# Patient Record
Sex: Female | Born: 1990 | Race: White | Hispanic: No | Marital: Single | Smoking: Former smoker
Health system: Southern US, Community
[De-identification: ages and names within clinical notes are randomized; demographics above are authoritative.]

## PROBLEM LIST (undated history)

## (undated) ENCOUNTER — Inpatient Hospital Stay: Payer: Self-pay

## (undated) DIAGNOSIS — M359 Systemic involvement of connective tissue, unspecified: Secondary | ICD-10-CM

## (undated) DIAGNOSIS — M419 Scoliosis, unspecified: Secondary | ICD-10-CM

## (undated) DIAGNOSIS — M329 Systemic lupus erythematosus, unspecified: Secondary | ICD-10-CM

## (undated) DIAGNOSIS — IMO0002 Reserved for concepts with insufficient information to code with codable children: Secondary | ICD-10-CM

## (undated) HISTORY — DX: Scoliosis, unspecified: M41.9

## (undated) HISTORY — PX: NO PAST SURGERIES: SHX2092

---
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
## 2008-04-14 ENCOUNTER — Ambulatory Visit: Payer: Self-pay | Admitting: Rheumatology

## 2008-12-16 ENCOUNTER — Emergency Department: Payer: Self-pay | Admitting: Unknown Physician Specialty

## 2011-02-27 ENCOUNTER — Emergency Department: Payer: Self-pay | Admitting: Emergency Medicine

## 2013-08-29 DIAGNOSIS — L930 Discoid lupus erythematosus: Secondary | ICD-10-CM | POA: Insufficient documentation

## 2013-11-18 ENCOUNTER — Emergency Department: Payer: Self-pay | Admitting: Emergency Medicine

## 2017-12-15 ENCOUNTER — Other Ambulatory Visit: Payer: Self-pay

## 2017-12-15 ENCOUNTER — Emergency Department
Admission: EM | Admit: 2017-12-15 | Discharge: 2017-12-15 | Disposition: A | Discharge status: Home / Self Care | Payer: Self-pay | Attending: Emergency Medicine | Admitting: Emergency Medicine

## 2017-12-15 ENCOUNTER — Encounter: Payer: Self-pay | Admitting: Emergency Medicine

## 2017-12-15 DIAGNOSIS — F17210 Nicotine dependence, cigarettes, uncomplicated: Secondary | ICD-10-CM | POA: Insufficient documentation

## 2017-12-15 DIAGNOSIS — R112 Nausea with vomiting, unspecified: Secondary | ICD-10-CM | POA: Insufficient documentation

## 2017-12-15 DIAGNOSIS — E8889 Other specified metabolic disorders: Secondary | ICD-10-CM | POA: Insufficient documentation

## 2017-12-15 DIAGNOSIS — E860 Dehydration: Secondary | ICD-10-CM | POA: Insufficient documentation

## 2017-12-15 HISTORY — DX: Reserved for concepts with insufficient information to code with codable children: IMO0002

## 2017-12-15 HISTORY — DX: Systemic lupus erythematosus, unspecified: M32.9

## 2017-12-15 LAB — COMPREHENSIVE METABOLIC PANEL
ALK PHOS: 61 U/L (ref 38–126)
ALT: 16 U/L (ref 0–44)
ANION GAP: 9 (ref 5–15)
AST: 22 U/L (ref 15–41)
Albumin: 4.7 g/dL (ref 3.5–5.0)
BUN: 9 mg/dL (ref 6–20)
CALCIUM: 9.7 mg/dL (ref 8.9–10.3)
CHLORIDE: 103 mmol/L (ref 98–111)
CO2: 26 mmol/L (ref 22–32)
CREATININE: 0.74 mg/dL (ref 0.44–1.00)
GFR calc non Af Amer: >60 mL/min (ref >60)
GLUCOSE: 131 mg/dL — AB (ref 70–99)
Potassium: 3.9 mmol/L (ref 3.5–5.1)
SODIUM: 138 mmol/L (ref 135–145)
Total Bilirubin: 0.8 mg/dL (ref 0.3–1.2)
Total Protein: 8 g/dL (ref 6.5–8.1)

## 2017-12-15 LAB — URINALYSIS, COMPLETE (UACMP) WITH MICROSCOPIC
Bacteria, UA: NONE SEEN
Bilirubin Urine: NEGATIVE
Glucose, UA: NEGATIVE mg/dL
Hgb urine dipstick: NEGATIVE
KETONES UR: 80 mg/dL — AB
Leukocytes, UA: NEGATIVE
Nitrite: NEGATIVE
PROTEIN: 100 mg/dL — AB
Specific Gravity, Urine: 1.026 (ref 1.005–1.030)
pH: 8 (ref 5.0–8.0)

## 2017-12-15 LAB — CBC
HCT: 42.8 % (ref 35.0–47.0)
HEMOGLOBIN: 14.6 g/dL (ref 12.0–16.0)
MCH: 31.2 pg (ref 26.0–34.0)
MCHC: 34.2 g/dL (ref 32.0–36.0)
MCV: 91.4 fL (ref 80.0–100.0)
Platelets: 327 10*3/uL (ref 150–440)
RBC: 4.69 MIL/uL (ref 3.80–5.20)
RDW: 13.7 % (ref 11.5–14.5)
WBC: 18.2 10*3/uL — ABNORMAL HIGH (ref 3.6–11.0)

## 2017-12-15 LAB — POCT PREGNANCY, URINE: Preg Test, Ur: NEGATIVE

## 2017-12-15 LAB — LIPASE, BLOOD: LIPASE: 19 U/L (ref 11–51)

## 2017-12-15 MED ORDER — ONDANSETRON HCL 4 MG PO TABS: 4.0000 mg | ORAL_TABLET | Freq: Every day | ORAL | 0 refills | Status: DC | PRN

## 2017-12-15 MED ORDER — SODIUM CHLORIDE 0.9 % IV BOLUS
1000.0000 mL | Freq: Once | INTRAVENOUS | Status: AC
  Administered 2017-12-15: 1000 mL via INTRAVENOUS

## 2017-12-15 MED ORDER — ONDANSETRON HCL 4 MG/2ML IJ SOLN
4.0000 mg | Freq: Once | INTRAMUSCULAR | Status: AC
  Administered 2017-12-15: 4 mg via INTRAVENOUS
  Filled 2017-12-15: qty 2

## 2017-12-15 NOTE — ED Triage Notes (Signed)
Vomiting x 2am today. Cant even keep water down.

## 2017-12-15 NOTE — ED Provider Notes (Signed)
[facility] Emergency Department Provider Note  ____________________________________________   First MD Initiated Contact with Patient 12/15/17 1751     (approximate)  I have reviewed the triage vital signs and the nursing notes.   HISTORY  Chief Complaint Emesis   HPI Michelle Massey is a 27 y.o. female who self presents to the emergency department with abdominal pain nausea and vomiting that began at 2 AM today roughly 15 hours prior to arrival.  Symptoms came on suddenly have been moderate to severe or worsened when trying to eat and improved when not.  The pain is nonradiating.    Past Medical History:  Diagnosis Date  . Lupus (HCC)     There are no active problems to display for this patient.   History reviewed. No pertinent surgical history.  Prior to Admission medications   Medication Sig Start Date End Date Taking? Authorizing Provider  ondansetron (ZOFRAN) 4 MG tablet Take 1 tablet (4 mg total) by mouth daily as needed. 12/15/17 12/15/18  Merrily Brittle, MD    Allergies Patient has no known allergies.  No family history on file.  Social History Social History   Tobacco Use  . Smoking status: Current Every Day Smoker    Packs/day: 0.50    Types: Cigarettes  Substance Use Topics  . Alcohol use: Not on file  . Drug use: Not on file    Review of Systems Constitutional: No fever/chills Eyes: No visual changes. ENT: No sore throat. Cardiovascular: Denies chest pain. Respiratory: Denies shortness of breath. Gastrointestinal: Positive for abdominal pain.  Positive for nausea, positive for vomiting.  No diarrhea.  No constipation. Genitourinary: Negative for dysuria. Musculoskeletal: Negative for back pain. Skin: Negative for rash. Neurological: Negative for headaches, focal weakness or numbness.   ____________________________________________   PHYSICAL EXAM:  VITAL SIGNS: ED Triage Vitals  Enc Vitals Group     BP 12/15/17  1542 106/79     Pulse Rate 12/15/17 1542 (!) 59     Resp 12/15/17 1542 20     Temp 12/15/17 1542 98.1 F (36.7 C)     Temp Source 12/15/17 1542 Oral     SpO2 12/15/17 1542 98 %     Weight 12/15/17 1544 140 lb (63.5 kg)     Height 12/15/17 1544 5\' 6"  (1.676 m)     Head Circumference --      Peak Flow --      Pain Score 12/15/17 1544 0     Pain Loc --      Pain Edu? --      Excl. in GC? --     Constitutional: Alert and oriented x4 pleasant cooperative speaks in full clear sentences no diaphoresis Eyes: PERRL EOMI. Head: Atraumatic. Nose: No congestion/rhinnorhea. Mouth/Throat: No trismus Neck: No stridor.   Cardiovascular: Normal rate, regular rhythm. Grossly normal heart sounds.  Good peripheral circulation. Respiratory: Normal respiratory effort.  No retractions. Lungs CTAB and moving good air Gastrointestinal: Soft nondistended nontender no rebound or guarding no peritonitis no McBurney's tenderness negative Rovsing's no costovertebral tenderness Musculoskeletal: No lower extremity edema   Neurologic:  Normal speech and language. No gross focal neurologic deficits are appreciated. Skin:  Skin is warm, dry and intact. No rash noted. Psychiatric: Mood and affect are normal. Speech and behavior are normal.    ____________________________________________   DIFFERENTIAL includes but not limited to  Morning sickness, ectopic pregnancy, cholecystitis, appendicitis, pyelonephritis ____________________________________________   LABS (all labs ordered are listed, but only  abnormal results are displayed)  Labs Reviewed  COMPREHENSIVE METABOLIC PANEL - Abnormal; Notable for the following components:      Result Value   Glucose, Bld 131 (*)    All other components within normal limits  CBC - Abnormal; Notable for the following components:   WBC 18.2 (*)    All other components within normal limits  URINALYSIS, COMPLETE (UACMP) WITH MICROSCOPIC - Abnormal; Notable for the  following components:   Color, Urine YELLOW (*)    APPearance HAZY (*)    Ketones, ur 80 (*)    Protein, ur 100 (*)    All other components within normal limits  LIPASE, BLOOD  POC URINE PREG, ED  POCT PREGNANCY, URINE    Lab work reviewed by me shows urinalysis with ketosis consistent with starvation.  Elevated white count is nonspecific and could be secondary to vomiting and pain __________________________________________  EKG   ____________________________________________  RADIOLOGY   ____________________________________________   PROCEDURES  Procedure(s) performed: no  Procedures  Critical Care performed: no  ____________________________________________   INITIAL IMPRESSION / ASSESSMENT AND PLAN / ED COURSE  Pertinent labs & imaging results that were available during my care of the patient were reviewed by me and considered in my medical decision making (see chart for details).   As part of my medical decision making, I reviewed the following data within the electronic MEDICAL RECORD NUMBER History obtained from family if available, nursing notes, old chart and ekg, as well as notes from prior ED visits.  By the time I saw the patient she had already received IV Zofran with significant improvement in her symptoms.  Her lab work shows an elevated white count but is otherwise unremarkable.  Her abdominal exam is completely benign.  I had a lengthy discussion with the patient and mother regarding the diagnostic uncertainty but that I did not believe she would benefit from ionizing radiation at this point.  Given a liter of IV fluids for her dehydration and another dose of Zofran and I will prescribe her Zofran for home.  Strict return precautions have been given.      ____________________________________________   FINAL CLINICAL IMPRESSION(S) / ED DIAGNOSES  Final diagnoses:  Intractable vomiting with nausea, unspecified vomiting type  Ketosis (HCC)  Dehydration       NEW MEDICATIONS STARTED DURING THIS VISIT:  Discharge Medication List as of 12/15/2017  6:13 PM    START taking these medications   Details  ondansetron (ZOFRAN) 4 MG tablet Take 1 tablet (4 mg total) by mouth daily as needed., Starting Sat 12/15/2017, Until Sun 12/15/2018, Print         Note:  This document was prepared using Dragon voice recognition software and may include unintentional dictation errors.     Merrily Brittleifenbark, Leanah Kolander, MD 12/16/17 [phone]

## 2017-12-15 NOTE — Discharge Instructions (Signed)
Please take your nausea medication as needed for severe symptoms and follow-up with primary care as needed.  Please make sure you return to the emergency department immediately for any new or worsening symptoms such as fevers, chills, worsening pain, if you cannot eat or drink, or for any other issues whatsoever.  It was a pleasure to take care of you today, and thank you for coming to our emergency department.  If you have any questions or concerns before leaving please ask the nurse to grab me and I'm more than happy to go through your aftercare instructions again.  If you were prescribed any opioid pain medication today such as Norco, Vicodin, Percocet, morphine, hydrocodone, or oxycodone please make sure you do not drive when you are taking this medication as it can alter your ability to drive safely.  If you have any concerns once you are home that you are not improving or are in fact getting worse before you can make it to your follow-up appointment, please do not hesitate to call 911 and come back for further evaluation.  Merrily BrittleNeil Morgana Rowley, MD  Results for orders placed or performed during the hospital encounter of 12/15/17  Lipase, blood  Result Value Ref Range   Lipase 19 11 - 51 U/L  Comprehensive metabolic panel  Result Value Ref Range   Sodium 138 135 - 145 mmol/L   Potassium 3.9 3.5 - 5.1 mmol/L   Chloride 103 98 - 111 mmol/L   CO2 26 22 - 32 mmol/L   Glucose, Bld 131 (H) 70 - 99 mg/dL   BUN 9 6 - 20 mg/dL   Creatinine, Ser 6.210.74 0.44 - 1.00 mg/dL   Calcium 9.7 8.9 - 30.810.3 mg/dL   Total Protein 8.0 6.5 - 8.1 g/dL   Albumin 4.7 3.5 - 5.0 g/dL   AST 22 15 - 41 U/L   ALT 16 0 - 44 U/L   Alkaline Phosphatase 61 38 - 126 U/L   Total Bilirubin 0.8 0.3 - 1.2 mg/dL   GFR calc non Af Amer >60 >60 mL/min   GFR calc Af Amer >60 >60 mL/min   Anion gap 9 5 - 15  CBC  Result Value Ref Range   WBC 18.2 (H) 3.6 - 11.0 K/uL   RBC 4.69 3.80 - 5.20 MIL/uL   Hemoglobin 14.6 12.0 - 16.0 g/dL     HCT 65.742.8 84.635.0 - 96.247.0 %   MCV 91.4 80.0 - 100.0 fL   MCH 31.2 26.0 - 34.0 pg   MCHC 34.2 32.0 - 36.0 g/dL   RDW 95.213.7 84.111.5 - 32.414.5 %   Platelets 327 150 - 440 K/uL  Urinalysis, Complete w Microscopic  Result Value Ref Range   Color, Urine YELLOW (A) YELLOW   APPearance HAZY (A) CLEAR   Specific Gravity, Urine 1.026 1.005 - 1.030   pH 8.0 5.0 - 8.0   Glucose, UA NEGATIVE NEGATIVE mg/dL   Hgb urine dipstick NEGATIVE NEGATIVE   Bilirubin Urine NEGATIVE NEGATIVE   Ketones, ur 80 (A) NEGATIVE mg/dL   Protein, ur 401100 (A) NEGATIVE mg/dL   Nitrite NEGATIVE NEGATIVE   Leukocytes, UA NEGATIVE NEGATIVE   RBC / HPF 6-10 0 - 5 RBC/hpf   WBC, UA 0-5 0 - 5 WBC/hpf   Bacteria, UA NONE SEEN NONE SEEN   Squamous Epithelial / LPF 6-10 0 - 5   Mucus PRESENT   Pregnancy, urine POC  Result Value Ref Range   Preg Test, Ur NEGATIVE NEGATIVE

## 2018-03-17 ENCOUNTER — Emergency Department
Admission: EM | Admit: 2018-03-17 | Discharge: 2018-03-17 | Disposition: A | Discharge status: Home / Self Care | Payer: Self-pay | Attending: Emergency Medicine | Admitting: Emergency Medicine

## 2018-03-17 ENCOUNTER — Emergency Department: Payer: Self-pay

## 2018-03-17 ENCOUNTER — Encounter: Payer: Self-pay | Admitting: Emergency Medicine

## 2018-03-17 DIAGNOSIS — R1084 Generalized abdominal pain: Secondary | ICD-10-CM

## 2018-03-17 DIAGNOSIS — F17210 Nicotine dependence, cigarettes, uncomplicated: Secondary | ICD-10-CM | POA: Insufficient documentation

## 2018-03-17 DIAGNOSIS — R112 Nausea with vomiting, unspecified: Secondary | ICD-10-CM

## 2018-03-17 DIAGNOSIS — Z79899 Other long term (current) drug therapy: Secondary | ICD-10-CM | POA: Insufficient documentation

## 2018-03-17 DIAGNOSIS — R1013 Epigastric pain: Secondary | ICD-10-CM | POA: Insufficient documentation

## 2018-03-17 HISTORY — DX: Systemic involvement of connective tissue, unspecified: M35.9

## 2018-03-17 LAB — CBC
HEMATOCRIT: 40.4 % (ref 36.0–46.0)
HEMOGLOBIN: 14 g/dL (ref 12.0–15.0)
MCH: 30.1 pg (ref 26.0–34.0)
MCHC: 34.7 g/dL (ref 30.0–36.0)
MCV: 86.9 fL (ref 80.0–100.0)
Platelets: 339 10*3/uL (ref 150–400)
RBC: 4.65 MIL/uL (ref 3.87–5.11)
RDW: 12.6 % (ref 11.5–15.5)
WBC: 9.2 10*3/uL (ref 4.0–10.5)
nRBC: 0 % (ref 0.0–0.2)

## 2018-03-17 LAB — I-STAT BETA HCG BLOOD, ED (NOT ORDERABLE): I-stat hCG, quantitative: <5 m[IU]/mL (ref <5)

## 2018-03-17 LAB — COMPREHENSIVE METABOLIC PANEL
ALBUMIN: 4.4 g/dL (ref 3.5–5.0)
ALT: 11 U/L (ref 0–44)
AST: 17 U/L (ref 15–41)
Alkaline Phosphatase: 46 U/L (ref 38–126)
Anion gap: 11 (ref 5–15)
BILIRUBIN TOTAL: 0.9 mg/dL (ref 0.3–1.2)
BUN: 13 mg/dL (ref 6–20)
CHLORIDE: 108 mmol/L (ref 98–111)
CO2: 17 mmol/L — ABNORMAL LOW (ref 22–32)
CREATININE: 0.67 mg/dL (ref 0.44–1.00)
Calcium: 9.5 mg/dL (ref 8.9–10.3)
GFR calc Af Amer: >60 mL/min (ref >60)
GLUCOSE: 92 mg/dL (ref 70–99)
POTASSIUM: 3.8 mmol/L (ref 3.5–5.1)
Sodium: 136 mmol/L (ref 135–145)
Total Protein: 7.2 g/dL (ref 6.5–8.1)

## 2018-03-17 LAB — LIPASE, BLOOD: Lipase: 24 U/L (ref 11–51)

## 2018-03-17 MED ORDER — METOCLOPRAMIDE HCL 5 MG/ML IJ SOLN
10.0000 mg | Freq: Once | INTRAMUSCULAR | Status: AC
  Administered 2018-03-17: 10 mg via INTRAVENOUS
  Filled 2018-03-17: qty 2

## 2018-03-17 MED ORDER — ONDANSETRON 4 MG PO TBDP: 4.0000 mg | ORAL_TABLET | Freq: Three times a day (TID) | ORAL | 0 refills | Status: DC | PRN

## 2018-03-17 MED ORDER — FAMOTIDINE IN NACL 20-0.9 MG/50ML-% IV SOLN
20.0000 mg | Freq: Once | INTRAVENOUS | Status: AC
  Administered 2018-03-17: 20 mg via INTRAVENOUS
  Filled 2018-03-17: qty 50

## 2018-03-17 MED ORDER — IOHEXOL 300 MG/ML  SOLN
75.0000 mL | Freq: Once | INTRAMUSCULAR | Status: AC | PRN
  Administered 2018-03-17: 75 mL via INTRAVENOUS

## 2018-03-17 MED ORDER — ONDANSETRON HCL 4 MG/2ML IJ SOLN
4.0000 mg | Freq: Once | INTRAMUSCULAR | Status: AC | PRN
  Administered 2018-03-17: 4 mg via INTRAVENOUS
  Filled 2018-03-17: qty 2

## 2018-03-17 MED ORDER — SODIUM CHLORIDE 0.9 % IV BOLUS
1000.0000 mL | Freq: Once | INTRAVENOUS | Status: AC
  Administered 2018-03-17: 1000 mL via INTRAVENOUS

## 2018-03-17 MED ORDER — FENTANYL CITRATE (PF) 100 MCG/2ML IJ SOLN
50.0000 ug | Freq: Once | INTRAMUSCULAR | Status: AC
  Administered 2018-03-17: 50 ug via INTRAVENOUS
  Filled 2018-03-17: qty 2

## 2018-03-17 NOTE — ED Notes (Signed)
Pt verbalized understanding of discharge instructions. NAD at this time. 

## 2018-03-17 NOTE — Discharge Instructions (Addendum)
As I explained to you, we are unable to rule out appendicitis at this time.  Therefore it is imperative that you return to the emergency room if your pain returns, is located on the right lower quadrant, if you have fever, if your nausea or vomiting recur.  Otherwise please [facility] with [facility] tomorrow for reevaluation.

## 2018-03-17 NOTE — Consult Note (Signed)
Surgical Consultation  03/17/2018  Michelle Massey is an 27 y.o. female.   Chief Complaint  Patient presents with  . Abdominal Pain  . Emesis     HPI: Dr. Alfred Levins requested surgical evaluation of this patient who presented to the ED with abdominal pain.  She woke up this AM with mild epigastric pain which became worse throughout the day.  Upon arrival to the ED, she began vomiting.  Her pain has migrated around her abdomen during her ED course.  At one point, it seemed to localize in the RLQ, so a CT scan was obtained to evaluate for appendicitis. No fevers or chills.  No diarrhea.  Emesis has been non-bloody and non-bilious.  She was in the hospital earlier this year for intractable nausea and vomiting, but apparently the abdominal pain that brought her to the ED is different than that. She denies pregnancy and states that her LMP was at the beginning of this month (roughly 20 days ago). Currently, she states that she is hungry and interested in eating dinner.  Past Medical History:  Diagnosis Date  . Collagen vascular disease ([place])   . Lupus ([place])     History reviewed. No pertinent surgical history.  No family history on file.  Social History:  reports that she has been smoking cigarettes. She has been smoking about 0.50 packs per day. She has never used smokeless tobacco. Her alcohol and drug histories are not on file.  Allergies: No Known Allergies  Medications reviewed.     ROS Full ROS performed and is otherwise negative other than what is stated in the HPI    BP (!) 142/98 (BP Location: Left Arm)   Pulse 70   Temp (!) 97.5 F (36.4 C) (Oral)   Resp 16   Ht '5\' 6"'  (1.676 m)   Wt 56.7 kg   SpO2 100%   BMI 20.18 kg/m   Physical Exam  Constitutional: She is oriented to person, place, and time. She appears well-developed.  Non-toxic appearance. She does not appear ill.  HENT:  Head: Normocephalic and atraumatic.  Poor dentition  Eyes: No scleral icterus.   Cardiovascular: Normal rate and regular rhythm.  Pulmonary/Chest: Effort normal.  Abdominal: Soft. Normal appearance and bowel sounds are normal.  Minimal tenderness on exam. Patient points to LUQ as site of most discomfort.  Able to tolerate deep palpation. No rebound, guarding, or other peritoneal signs. Specifically, there is minimal pain at McBurney's point.  Neurological: She is alert and oriented to person, place, and time.  Skin: Skin is warm and dry.  Psychiatric: She has a normal mood and affect.      Results for orders placed or performed during the hospital encounter of 03/17/18 (from the past 48 hour(s))  Lipase, blood     Status: None   Collection Time: 03/17/18  3:07 PM  Result Value Ref Range   Lipase 24 11 - 51 U/L    Comment: Performed at [facility], [address] [place], [place] [address]  Comprehensive metabolic panel     Status: Abnormal   Collection Time: 03/17/18  3:07 PM  Result Value Ref Range   Sodium 136 135 - 145 mmol/L   Potassium 3.8 3.5 - 5.1 mmol/L    Comment: HEMOLYSIS AT THIS LEVEL MAY AFFECT RESULT   Chloride 108 98 - 111 mmol/L   CO2 17 (L) 22 - 32 mmol/L   Glucose, Bld 92 70 - 99 mg/dL   BUN 13 6 -  20 mg/dL   Creatinine, Ser 0.67 0.44 - 1.00 mg/dL   Calcium 9.5 8.9 - 10.3 mg/dL   Total Protein 7.2 6.5 - 8.1 g/dL   Albumin 4.4 3.5 - 5.0 g/dL   AST 17 15 - 41 U/L   ALT 11 0 - 44 U/L   Alkaline Phosphatase 46 38 - 126 U/L   Total Bilirubin 0.9 0.3 - 1.2 mg/dL   GFR calc non Af Amer >60 >60 mL/min   GFR calc Af Amer >60 >60 mL/min    Comment: (NOTE) The eGFR has been calculated using the CKD EPI equation. This calculation has not been validated in all clinical situations. eGFR's persistently <60 mL/min signify possible Chronic Kidney Disease.    Anion gap 11 5 - 15    Comment: Performed at [facility], [address] [place], [place] [address]  CBC     Status: None   Collection Time: 03/17/18  3:07 PM  Result  Value Ref Range   WBC 9.2 4.0 - 10.5 K/uL   RBC 4.65 3.87 - 5.11 MIL/uL   Hemoglobin 14.0 12.0 - 15.0 g/dL   HCT 40.4 36.0 - 46.0 %   MCV 86.9 80.0 - 100.0 fL   MCH 30.1 26.0 - 34.0 pg   MCHC 34.7 30.0 - 36.0 g/dL   RDW 12.6 11.5 - 15.5 %   Platelets 339 150 - 400 K/uL   nRBC 0.0 0.0 - 0.2 %    Comment: Performed at [facility], [address] [place], [place] [address]  I-Stat beta hCG blood, ED     Status: None   Collection Time: 03/17/18  4:03 PM  Result Value Ref Range   I-stat hCG, quantitative <5.0 <5 mIU/mL   Comment 3            Comment:   GEST. AGE      CONC.  (mIU/mL)   <=1 WEEK        5 - 50     2 WEEKS       50 - 500     3 WEEKS       100 - 10,000     4 WEEKS     1,000 - 30,000        FEMALE AND NON-PREGNANT FEMALE:     LESS THAN 5 mIU/mL    Ct Abdomen Pelvis W Contrast  Result Date: 03/17/2018 CLINICAL DATA:  Mid to right upper abdominal pain since this morning with vomiting. No leukocytosis. History of lupus/collagen vascular disease. EXAM: CT ABDOMEN AND PELVIS WITH CONTRAST TECHNIQUE: Multidetector CT imaging of the abdomen and pelvis was performed using the standard protocol following bolus administration of intravenous contrast. CONTRAST:  63m OMNIPAQUE IOHEXOL 300 MG/ML  SOLN COMPARISON:  None. FINDINGS: Lower chest: Clear lung bases. No significant pleural or pericardial effusion. Hepatobiliary: The liver is normal in density without focal abnormality. No evidence of gallstones, gallbladder wall thickening or biliary dilatation. Pancreas: Unremarkable. No pancreatic ductal dilatation or surrounding inflammatory changes. Spleen: Normal in size without focal abnormality. Adrenals/Urinary Tract: Both adrenal glands appear normal. There are probable tiny left renal cysts. The kidneys, ureters and bladder otherwise appear normal. There is no evidence of urinary tract calculus or hydronephrosis. Stomach/Bowel: No enteric contrast was administered. The stomach  and small bowel appear normal. The colon is decompressed without wall thickening or surrounding inflammation. The cecum is located in the right pelvis. The appendix is fluid-filled and distended to 11 mm. Its wall  is mildly prominent, although there is no surrounding inflammation. Vascular/Lymphatic: There are no enlarged abdominal or pelvic lymph nodes. There are small ileocolonic mesenteric lymph nodes. No significant vascular findings. Reproductive: The uterus appears normal. Both ovaries are prominent. There is a small amount of free pelvic fluid. Other: No generalized ascites or focal extraluminal fluid collection. No free air. Musculoskeletal: No acute or significant osseous findings. IMPRESSION: 1. The appendix is fluid-filled and distended but without surrounding inflammation. This could reflect early acute appendicitis. No evidence of rupture, abscess or bowel obstruction. 2. No other significant findings. The gallbladder appears normal. Free pelvic fluid is within physiologic limits. 3. These results were called by telephone at the time of interpretation on 03/17/2018 at 5:51 pm to Dr. Rudene Re , who verbally acknowledged these results. Electronically Signed   By: Richardean Sale M.D.   On: 03/17/2018 17:52    Assessment/Plan: 27 yo F who came into the ED with abdominal pain. Her pain has not worsened and seems to shift location depending on the examiner and time of examination. Her abdominal exam is unconcerning, as are her labs.  While there are findings on CT that could suggest early appendicitis, her presentation is atypical and her examination does not correlate  At this time, I would not offer her appendectomy.  She can likely be d/c home with clear return precautions. No explicit General Surgery follow up at this time.  Fredirick Maudlin

## 2018-03-17 NOTE — ED Notes (Signed)
Hx of mother having gallbladder removed

## 2018-03-17 NOTE — ED Notes (Signed)
Actively vomiting in triage.  Green emesis

## 2018-03-17 NOTE — ED Triage Notes (Signed)
ARrives with C/o mid / right upper abdominal pain since this am.  Reports vomiting with symptoms.

## 2018-03-17 NOTE — ED Provider Notes (Signed)
[facility] Emergency Department Provider Note  ____________________________________________  Time seen: Approximately 3:32 PM  I have reviewed the triage vital signs and the nursing notes.   HISTORY  Chief Complaint Abdominal Pain and Emesis   HPI Michelle Massey is a 27 y.o. female presents for evaluation of abdominal pain nausea and vomiting.  Patient reports that she woke up this morning with mild epigastric abdominal pain.  Throughout the day the pain became progressively worse.  At this time her pain is 10 out of 10, stabbing, located in the epigastric region, constant and nonradiating.  She has had several episodes of nonbloody nonbilious emesis.  No fever or chills, no chest pain or shortness of breath, no dysuria or hematuria, no vaginal bleeding or vaginal discharge.  No prior abdominal surgeries.  Patient denies any prior similar episodes.  She has been seen in the past in the emergency room for intractable nausea and vomiting but reports never having pain like this before.  She denies drug use and alcohol.  She is a smoker.  Past Medical History:  Diagnosis Date  . Collagen vascular disease (HCC)   . Lupus (HCC)     Prior to Admission medications   Medication Sig Start Date End Date Taking? Authorizing Provider  ondansetron (ZOFRAN ODT) 4 MG disintegrating tablet Take 1 tablet (4 mg total) by mouth every 8 (eight) hours as needed. 03/17/18   Nita Sickle, MD  ondansetron (ZOFRAN) 4 MG tablet Take 1 tablet (4 mg total) by mouth daily as needed. 12/15/17 12/15/18  Merrily Brittle, MD    Allergies Patient has no known allergies.  No family history on file.  Social History Social History   Tobacco Use  . Smoking status: Current Every Day Smoker    Packs/day: 0.50    Types: Cigarettes  . Smokeless tobacco: Never Used  Substance Use Topics  . Alcohol use: Not on file  . Drug use: Not on file    Review of Systems  Constitutional:  Negative for fever. Eyes: Negative for visual changes. ENT: Negative for sore throat. Neck: No neck pain  Cardiovascular: Negative for chest pain. Respiratory: Negative for shortness of breath. Gastrointestinal: + abdominal pain, nausea, and vomiting. No diarrhea. Genitourinary: Negative for dysuria. Musculoskeletal: Negative for back pain. Skin: Negative for rash. Neurological: Negative for headaches, weakness or numbness. Psych: No SI or HI  ____________________________________________   PHYSICAL EXAM:  VITAL SIGNS: ED Triage Vitals  Enc Vitals Group     BP 03/17/18 1457 (!) 142/98     Pulse Rate 03/17/18 1457 70     Resp 03/17/18 1457 20     Temp --      Temp Source 03/17/18 1457 Oral     SpO2 03/17/18 1457 100 %     Weight 03/17/18 1458 125 lb (56.7 kg)     Height 03/17/18 1458 5\' 6"  (1.676 m)     Head Circumference --      Peak Flow --      Pain Score 03/17/18 1457 8     Pain Loc --      Pain Edu? --      Excl. in GC? --     Constitutional: Alert and oriented, in significant distress due to pain, laying prone, actively vomiting.  HEENT:      Head: Normocephalic and atraumatic.         Eyes: Conjunctivae are normal. Sclera is non-icteric.       Mouth/Throat: Mucous membranes  are moist.       Neck: Supple with no signs of meningismus. Cardiovascular: Regular rate and rhythm. No murmurs, gallops, or rubs. 2+ symmetrical distal pulses are present in all extremities. No JVD. Respiratory: Normal respiratory effort. Lungs are clear to auscultation bilaterally. No wheezes, crackles, or rhonchi.  Gastrointestinal: Soft, diffuse tenderness to palpation worse on the upper quadrants, and non distended with positive bowel sounds. No rebound or guarding. Musculoskeletal: Nontender with normal range of motion in all extremities. No edema, cyanosis, or erythema of extremities. Neurologic: Normal speech and language. Face is symmetric. Moving all extremities. No gross focal  neurologic deficits are appreciated. Skin: Skin is warm, dry and intact. No rash noted. Psychiatric: Mood and affect are normal. Speech and behavior are normal.  ____________________________________________   LABS (all labs ordered are listed, but only abnormal results are displayed)  Labs Reviewed  COMPREHENSIVE METABOLIC PANEL - Abnormal; Notable for the following components:      Result Value   CO2 17 (*)    All other components within normal limits  LIPASE, BLOOD  CBC  URINALYSIS, COMPLETE (UACMP) WITH MICROSCOPIC  URINE DRUG SCREEN, QUALITATIVE ([facility] ONLY)  I-STAT BETA HCG BLOOD, ED ([facility], [facility], [facility] ONLY)  I-STAT BETA HCG BLOOD, ED (NOT ORDERABLE)   ____________________________________________  EKG  ED ECG REPORT I, Nita Sickle, the attending physician, personally viewed and interpreted this ECG.  Sinus bradycardia, rate of 46, normal intervals, right axis deviation, no ST elevations or depressions.  No prior for comparison. ____________________________________________  RADIOLOGY  I have personally reviewed the images performed during this visit and I agree with the Radiologist's read.   Interpretation by Radiologist:  Ct Abdomen Pelvis W Contrast  Result Date: 03/17/2018 CLINICAL DATA:  Mid to right upper abdominal pain since this morning with vomiting. No leukocytosis. History of lupus/collagen vascular disease. EXAM: CT ABDOMEN AND PELVIS WITH CONTRAST TECHNIQUE: Multidetector CT imaging of the abdomen and pelvis was performed using the standard protocol following bolus administration of intravenous contrast. CONTRAST:  75mL OMNIPAQUE IOHEXOL 300 MG/ML  SOLN COMPARISON:  None. FINDINGS: Lower chest: Clear lung bases. No significant pleural or pericardial effusion. Hepatobiliary: The liver is normal in density without focal abnormality. No evidence of gallstones, gallbladder wall thickening or biliary dilatation. Pancreas: Unremarkable. No pancreatic ductal dilatation  or surrounding inflammatory changes. Spleen: Normal in size without focal abnormality. Adrenals/Urinary Tract: Both adrenal glands appear normal. There are probable tiny left renal cysts. The kidneys, ureters and bladder otherwise appear normal. There is no evidence of urinary tract calculus or hydronephrosis. Stomach/Bowel: No enteric contrast was administered. The stomach and small bowel appear normal. The colon is decompressed without wall thickening or surrounding inflammation. The cecum is located in the right pelvis. The appendix is fluid-filled and distended to 11 mm. Its wall is mildly prominent, although there is no surrounding inflammation. Vascular/Lymphatic: There are no enlarged abdominal or pelvic lymph nodes. There are small ileocolonic mesenteric lymph nodes. No significant vascular findings. Reproductive: The uterus appears normal. Both ovaries are prominent. There is a small amount of free pelvic fluid. Other: No generalized ascites or focal extraluminal fluid collection. No free air. Musculoskeletal: No acute or significant osseous findings. IMPRESSION: 1. The appendix is fluid-filled and distended but without surrounding inflammation. This could reflect early acute appendicitis. No evidence of rupture, abscess or bowel obstruction. 2. No other significant findings. The gallbladder appears normal. Free pelvic fluid is within physiologic limits. 3. These results were called by telephone at the time  of interpretation on 03/17/2018 at 5:51 pm to Dr. Nita Sickle , who verbally acknowledged these results. Electronically Signed   By: Carey Bullocks M.D.   On: 03/17/2018 17:52      ____________________________________________   PROCEDURES  Procedure(s) performed: None Procedures Critical Care performed:  None ____________________________________________   INITIAL IMPRESSION / ASSESSMENT AND PLAN / ED COURSE  27 y.o. female presents for evaluation of abdominal pain nausea and  vomiting.  Patient is in significant distress, laying prone in bed and actively vomiting, she looks very uncomfortable, her abdomen is soft with diffuse tenderness worse in the upper quadrants, she is afebrile with normal vital signs otherwise.  Her abdomen is soft with no distention but diffuse tenderness throughout, there is no localized tenderness, rebound or guarding.  Differential diagnosis broad and includes but not limited to peptic ulcer disease versus gastritis versus pancreatitis versus gallbladder disease versus appendicitis versus ovarian pathology versus ectopic pregnancy.  Will give IV fluids, fentanyl, Reglan since patient has received Zofran in the waiting room with no significant improvement.  Will get CBC, CMP, lipase, hCG, and urine studies to help guide which kind of imaging patient needs.  Clinical Course as of Mar 17 1914  Wynelle Link Mar 17, 2018  1643 Patient is slightly more comfortable at this time.  She is tender to palpation on the right lower quadrant therefore a CT abdomen pelvis has been ordered to rule out appendicitis.   [CV]  1910 CT was inconclusive for appendicitis.  Discussed with Dr. Lady Gary, surgery on-call who evaluated patient in the emergency room.  She examined the patient and looked at the CT and labs.  Patient at this time has no right lower quadrant tenderness, her abdomen is very soft with no tenderness throughout.  That with a normal white count and normal vitals Dr. Lady Gary believes that patient does not have appendicitis.  I also agree that the clinical picture at this time is not consistent with appendicitis.  I have given very strict return precautions to the patient.  I will provide her with a prescription for Zofran but told her I would not give her any pain medication because if her pain gets worse I would like for her to come back to the emergency room.  Recommended 24-hour reevaluation by her primary care doctor.   [CV]    Clinical Course User Index [CV]  Don Perking [place], MD     As part of my medical decision making, I reviewed the following data within the electronic MEDICAL RECORD NUMBER Nursing notes reviewed and incorporated, Labs reviewed , EKG interpreted , Old chart reviewed, Radiograph reviewed , A consult was requested and obtained from this/these consultant(s) Surgery, Notes from prior ED visits and [place] Controlled Substance Database    Pertinent labs & imaging results that were available during my care of the patient were reviewed by me and considered in my medical decision making (see chart for details).    ____________________________________________   FINAL CLINICAL IMPRESSION(S) / ED DIAGNOSES  Final diagnoses:  Generalized abdominal pain  Non-intractable vomiting with nausea, unspecified vomiting type      NEW MEDICATIONS STARTED DURING THIS VISIT:  ED Discharge Orders         Ordered    ondansetron (ZOFRAN ODT) 4 MG disintegrating tablet  Every 8 hours PRN     03/17/18 1913           Note:  This document was prepared using Dragon voice recognition software and may include unintentional dictation  errors.    Nita SickleVeronese, [place], MD 03/17/18 [phone]

## 2018-07-11 ENCOUNTER — Other Ambulatory Visit: Payer: Self-pay

## 2018-07-11 ENCOUNTER — Emergency Department
Admission: EM | Admit: 2018-07-11 | Discharge: 2018-07-11 | Disposition: A | Discharge status: Home / Self Care | Payer: Self-pay | Attending: Emergency Medicine | Admitting: Emergency Medicine

## 2018-07-11 ENCOUNTER — Encounter: Payer: Self-pay | Admitting: Emergency Medicine

## 2018-07-11 DIAGNOSIS — Z5321 Procedure and treatment not carried out due to patient leaving prior to being seen by health care provider: Secondary | ICD-10-CM | POA: Insufficient documentation

## 2018-07-11 DIAGNOSIS — R55 Syncope and collapse: Secondary | ICD-10-CM | POA: Insufficient documentation

## 2018-07-11 LAB — URINALYSIS, COMPLETE (UACMP) WITH MICROSCOPIC
Bacteria, UA: NONE SEEN
Bilirubin Urine: NEGATIVE
Glucose, UA: NEGATIVE mg/dL
Hgb urine dipstick: NEGATIVE
Ketones, ur: NEGATIVE mg/dL
Leukocytes,Ua: NEGATIVE
Nitrite: NEGATIVE
PROTEIN: NEGATIVE mg/dL
Specific Gravity, Urine: 1.009 (ref 1.005–1.030)
pH: 6 (ref 5.0–8.0)

## 2018-07-11 LAB — CBC
HCT: 42.4 % (ref 36.0–46.0)
HEMOGLOBIN: 14 g/dL (ref 12.0–15.0)
MCH: 30.2 pg (ref 26.0–34.0)
MCHC: 33 g/dL (ref 30.0–36.0)
MCV: 91.4 fL (ref 80.0–100.0)
Platelets: 310 10*3/uL (ref 150–400)
RBC: 4.64 MIL/uL (ref 3.87–5.11)
RDW: 13.2 % (ref 11.5–15.5)
WBC: 8.2 10*3/uL (ref 4.0–10.5)
nRBC: 0 % (ref 0.0–0.2)

## 2018-07-11 LAB — BASIC METABOLIC PANEL
Anion gap: 7 (ref 5–15)
BUN: 10 mg/dL (ref 6–20)
CHLORIDE: 104 mmol/L (ref 98–111)
CO2: 26 mmol/L (ref 22–32)
Calcium: 8.8 mg/dL — ABNORMAL LOW (ref 8.9–10.3)
Creatinine, Ser: 0.72 mg/dL (ref 0.44–1.00)
GFR calc Af Amer: >60 mL/min (ref >60)
GFR calc non Af Amer: >60 mL/min (ref >60)
Glucose, Bld: 98 mg/dL (ref 70–99)
Potassium: 3.5 mmol/L (ref 3.5–5.1)
Sodium: 137 mmol/L (ref 135–145)

## 2018-07-11 LAB — POCT PREGNANCY, URINE: Preg Test, Ur: NEGATIVE

## 2018-07-11 MED ORDER — SODIUM CHLORIDE 0.9% FLUSH: 3.0000 mL | Freq: Once | INTRAVENOUS | Status: DC

## 2018-07-11 NOTE — ED Triage Notes (Addendum)
PT states she has a syncopal episode today. Pt states she has had lightheadness in the past. PT denies any CP or SOB. NAD noted . Denies head injury

## 2018-10-15 ENCOUNTER — Emergency Department
Admission: EM | Admit: 2018-10-15 | Discharge: 2018-10-16 | Disposition: A | Discharge status: Home / Self Care | Payer: Self-pay | Attending: Student in an Organized Health Care Education/Training Program | Admitting: Student in an Organized Health Care Education/Training Program

## 2018-10-15 ENCOUNTER — Other Ambulatory Visit: Payer: Self-pay

## 2018-10-15 DIAGNOSIS — R519 Headache, unspecified: Secondary | ICD-10-CM

## 2018-10-15 DIAGNOSIS — Z79899 Other long term (current) drug therapy: Secondary | ICD-10-CM | POA: Insufficient documentation

## 2018-10-15 DIAGNOSIS — F17210 Nicotine dependence, cigarettes, uncomplicated: Secondary | ICD-10-CM | POA: Insufficient documentation

## 2018-10-15 DIAGNOSIS — R51 Headache: Secondary | ICD-10-CM | POA: Insufficient documentation

## 2018-10-15 MED ORDER — DEXAMETHASONE SODIUM PHOSPHATE 10 MG/ML IJ SOLN
10.0000 mg | Freq: Once | INTRAMUSCULAR | Status: AC
  Administered 2018-10-16: 10 mg via INTRAVENOUS
  Filled 2018-10-15: qty 1

## 2018-10-15 MED ORDER — DIPHENHYDRAMINE HCL 50 MG/ML IJ SOLN
12.5000 mg | Freq: Once | INTRAMUSCULAR | Status: AC
  Administered 2018-10-16: 12.5 mg via INTRAVENOUS
  Filled 2018-10-15: qty 1

## 2018-10-15 MED ORDER — PROCHLORPERAZINE EDISYLATE 10 MG/2ML IJ SOLN
10.0000 mg | Freq: Once | INTRAMUSCULAR | Status: AC
  Administered 2018-10-16: 10 mg via INTRAVENOUS
  Filled 2018-10-15: qty 2

## 2018-10-15 MED ORDER — SODIUM CHLORIDE 0.9 % IV BOLUS
1000.0000 mL | Freq: Once | INTRAVENOUS | Status: AC
  Administered 2018-10-16: 1000 mL via INTRAVENOUS

## 2018-10-15 NOTE — ED Triage Notes (Signed)
Pt in with co migraine since this am and nausea, hx of the same. Took tylenol and ibuprofen without relief.

## 2018-10-15 NOTE — ED Provider Notes (Signed)
[facility] Emergency Department Provider Note    First MD Initiated Contact with Patient 10/15/18 2345     (approximate)  I have reviewed the triage vital signs and the nursing notes.   HISTORY  Chief Complaint Migraine    HPI Michelle Massey is a 28 y.o. female listed past medical history presents the ER for headache.  States he has a history of migraine headaches.  This 1 is been ongoing since yesterday.  Was not sudden onset.  Denies any neck stiffness.  Denies any fevers.  No congestion cough or shortness of breath.  Has felt nauseated.  Does endorse photophobia.  Tried some Tylenol Motrin without any improvement.  Reports headache is moderate in nature.    Past Medical History:  Diagnosis Date  . Collagen vascular disease ([place])   . Lupus ([place])    No family history on file. No past surgical history on file. There are no active problems to display for this patient.     Prior to Admission medications   Medication Sig Start Date End Date Taking? Authorizing Provider  ondansetron (ZOFRAN ODT) 4 MG disintegrating tablet Take 1 tablet (4 mg total) by mouth every 8 (eight) hours as needed. 03/17/18   Rudene Re, MD  ondansetron (ZOFRAN) 4 MG tablet Take 1 tablet (4 mg total) by mouth daily as needed. 12/15/17 12/15/18  Darel Hong, MD    Allergies Patient has no known allergies.    Social History Social History   Tobacco Use  . Smoking status: Current Every Day Smoker    Packs/day: 0.50    Types: Cigarettes  . Smokeless tobacco: Never Used  Substance Use Topics  . Alcohol use: Not Currently  . Drug use: Never    Review of Systems Patient denies headaches, rhinorrhea, blurry vision, numbness, shortness of breath, chest pain, edema, cough, abdominal pain, nausea, vomiting, diarrhea, dysuria, fevers, rashes or hallucinations unless otherwise stated above in HPI. ____________________________________________   PHYSICAL EXAM:   VITAL SIGNS: Vitals:   10/15/18 2018  BP: (!) 145/94  Pulse: (!) 112  Resp: 20  Temp: 99.6 F (37.6 C)  SpO2: 99%    Constitutional: Alert and oriented. Nontoxic appearing.  Ambulating into room without difficulty Eyes: Conjunctivae are normal.  Head: Atraumatic. Nose: No congestion/rhinnorhea. Mouth/Throat: Mucous membranes are moist.   Neck: No stridor. Painless ROM.  Cardiovascular: Normal rate, regular rhythm. Grossly normal heart sounds.  Good peripheral circulation. Respiratory: Normal respiratory effort.  No retractions. Lungs CTAB. Gastrointestinal: Soft and nontender. No distention. No abdominal bruits. No CVA tenderness. Genitourinary: deferred Musculoskeletal: No lower extremity tenderness nor edema.  No joint effusions. Neurologic CN- intact.  No facial droop, Normal FNF.  Normal heel to shin.  Sensation intact bilaterally. Normal speech and language. No gross focal neurologic deficits are appreciated. No gait instability. Skin:  Skin is warm, dry and intact. No rash noted. Psychiatric: Mood and affect are normal. Speech and behavior are normal.  ____________________________________________   LABS (all labs ordered are listed, but only abnormal results are displayed)  No results found for this or any previous visit (from the past 24 hour(s)). ____________________________________________ ____________________________________________  [id number]   ____________________________________________   PROCEDURES  Procedure(s) performed:  Procedures    Critical Care performed: no ____________________________________________   INITIAL IMPRESSION / ASSESSMENT AND PLAN / ED COURSE  Pertinent labs & imaging results that were available during my care of the patient were reviewed by me and considered in my medical decision making (  see chart for details).   DDX: tension, cluster, migraine, uri, covid19, doubt mening or encephalitis  [name] is a 28 y.o. who  presents to the ED with with Hx of migraines p/w HA for last 2 days. Not worst HA ever. Gradual onset. HA similar to previous episodes. Denies focal neurologic symptoms. Denies trauma. No fevers or neck pain. No vision loss. Afebrile in ED. VSS. Exam as above. No meningeal signs. No CN, motor, sensory or cerebellar deficits. Temporal arteries palpable and non-tender. Appears well and non-toxic.  Will provide IV fluids for hydration and IV medications for symptom control. Likely tension, non-specific or possible migraine HA. Clinical picture is not consistent with ICH, SAH, SDH, EDH, TIA, or CVA. No concern for meningitis or encephalitis. No concern for GCA/Temporal arteritis. .    Clinical Course as of Oct 16 119  Wed Oct 16, 2018  0120 Patient reassessed.  Headache resolved.  Repeat neuro exam reassuring and unchanged.  Stable appropriate for outpatient follow-up.   [PR]    Clinical Course User Index [PR] Willy Eddyobinson, Madelaine Whipple, MD    The patient was evaluated in Emergency Department today for the symptoms described in the history of present illness. He/she was evaluated in the context of the global COVID-19 pandemic, which necessitated consideration that the patient might be at risk for infection with the SARS-CoV-2 virus that causes COVID-19. Institutional protocols and algorithms that pertain to the evaluation of patients at risk for COVID-19 are in a state of rapid change based on information released by regulatory bodies including the CDC and federal and state organizations. These policies and algorithms were followed during the patient's care in the ED.  As part of my medical decision making, I reviewed the following data within the electronic MEDICAL RECORD NUMBER Nursing notes reviewed and incorporated, Labs reviewed, notes from prior ED visits and [place] Controlled Substance Database   ____________________________________________   FINAL CLINICAL IMPRESSION(S) / ED DIAGNOSES  Final diagnoses:   Bad headache      NEW MEDICATIONS STARTED DURING THIS VISIT:  New Prescriptions   No medications on file     Note:  This document was prepared using Dragon voice recognition software and may include unintentional dictation errors.    Willy Eddyobinson, Mical Brun, MD 10/16/18 [phone]

## 2018-10-16 NOTE — Discharge Instructions (Addendum)

## 2018-10-18 ENCOUNTER — Telehealth: Payer: Self-pay | Admitting: *Deleted

## 2018-10-18 NOTE — Telephone Encounter (Signed)
Coronavirus (COVID-19) Are you at risk?  Are you at risk for the Coronavirus (COVID-19)?  To be considered HIGH RISK for Coronavirus (COVID-19), you have to meet the following criteria:  . Traveled to [place], [place], [place], [place] or [place]; or in the [place] to [place], [place], [place], or [place]; and have fever, cough, and shortness of breath within the last 2 weeks of travel OR . Been in close contact with a person diagnosed with COVID-19 within the last 2 weeks and have fever, cough, and shortness of breath . IF YOU DO NOT MEET THESE CRITERIA, YOU ARE CONSIDERED LOW RISK FOR COVID-19.  What to do if you are HIGH RISK for COVID-19?  . If you are having a medical emergency, call 911. . Seek medical care right away. Before you go to a doctor's office, urgent care or emergency department, call ahead and tell them about your recent travel, contact with someone diagnosed with COVID-19, and your symptoms. You should receive instructions from your physician's office regarding next steps of care.  . When you arrive at healthcare provider, tell the healthcare staff immediately you have returned from visiting [place], [place], [place], [place] or [place]; or traveled in the [place] to [place], [place], [place], or [place]; in the last two weeks or you have been in close contact with a person diagnosed with COVID-19 in the last 2 weeks.   . Tell the health care staff about your symptoms: fever, cough and shortness of breath. . After you have been seen by a medical provider, you will be either: o Tested for (COVID-19) and discharged home on quarantine except to seek medical care if symptoms worsen, and asked to  - Stay home and avoid contact with others until you get your results (4-5 days)  - Avoid travel on public transportation if possible (such as bus, train, or airplane) or o Sent to the Emergency Department by EMS for evaluation, COVID-19 testing, and possible  admission depending on your condition and test results.  What to do if you are LOW RISK for COVID-19?  Reduce your risk of any infection by using the same precautions used for avoiding the common cold or flu:  . Wash your hands often with soap and warm water for at least 20 seconds.  If soap and water are not readily available, use an alcohol-based hand sanitizer with at least 60% alcohol.  . If coughing or sneezing, cover your mouth and nose by coughing or sneezing into the elbow areas of your shirt or coat, into a tissue or into your sleeve (not your hands). . Avoid shaking hands with others and consider head nods or verbal greetings only. . Avoid touching your eyes, nose, or mouth with unwashed hands.  . Avoid close contact with people who are sick. . Avoid places or events with large numbers of people in one location, like concerts or sporting events. . Carefully consider travel plans you have or are making. . If you are planning any travel outside or inside the [place], visit the CDC's Travelers' Health webpage for the latest health notices. . If you have some symptoms but not all symptoms, continue to monitor at home and seek medical attention if your symptoms worsen. . If you are having a medical emergency, call 911.   ADDITIONAL HEALTHCARE OPTIONS FOR PATIENTS  [facility] / e-Visit: [email or web address]         [facility]: [phone]  [facility]: [phone]                   [facility]: [phone]   Spoke with pt denies any sx.   , CMA 

## 2018-10-21 ENCOUNTER — Encounter: Payer: Self-pay | Admitting: Certified Nurse Midwife

## 2018-10-21 ENCOUNTER — Ambulatory Visit (INDEPENDENT_AMBULATORY_CARE_PROVIDER_SITE_OTHER): Payer: Medicaid Other | Admitting: Certified Nurse Midwife

## 2018-10-21 ENCOUNTER — Other Ambulatory Visit: Payer: Self-pay

## 2018-10-21 ENCOUNTER — Other Ambulatory Visit (HOSPITAL_COMMUNITY)
Admission: RE | Admit: 2018-10-21 | Discharge: 2018-10-21 | Disposition: A | Discharge status: Home / Self Care | Payer: Medicaid Other | Source: Ambulatory Visit | Attending: Certified Nurse Midwife | Admitting: Certified Nurse Midwife

## 2018-10-21 VITALS — BP 92/68 | HR 84 | Ht 66.0 in | Wt 133.2 lb

## 2018-10-21 DIAGNOSIS — Z01419 Encounter for gynecological examination (general) (routine) without abnormal findings: Secondary | ICD-10-CM

## 2018-10-21 DIAGNOSIS — M545 Low back pain, unspecified: Secondary | ICD-10-CM

## 2018-10-21 DIAGNOSIS — Z0000 Encounter for general adult medical examination without abnormal findings: Secondary | ICD-10-CM

## 2018-10-21 DIAGNOSIS — Z124 Encounter for screening for malignant neoplasm of cervix: Secondary | ICD-10-CM | POA: Diagnosis present

## 2018-10-21 DIAGNOSIS — G8929 Other chronic pain: Secondary | ICD-10-CM

## 2018-10-21 NOTE — Patient Instructions (Signed)
Chronic Back Pain When back pain lasts longer than 3 months, it is called chronic back pain. Pain may get worse at certain times (flare-ups). There are things you can do at home to manage your pain. Follow these instructions at home: Activity      Avoid bending and other activities that make pain worse.  When standing: ? Keep your upper back and neck straight. ? Keep your shoulders pulled back. ? Avoid slouching.  When sitting: ? Keep your back straight. ? Relax your shoulders. Do not round your shoulders or pull them backward.  Do not sit or stand in one place for long periods of time.  Take short rest breaks during the day. Lying down or standing is usually better than sitting. Resting can help relieve pain.  When sitting or lying down for a long time, do some mild activity or stretching. This will help to prevent stiffness and pain.  Get regular exercise. Ask your doctor what activities are safe for you.  Do not lift anything that is heavier than 10 lb (4.5 kg). To prevent injury when you lift things: ? Bend your knees. ? Keep the weight close to your body. ? Avoid twisting. Managing pain  If told, put ice on the painful area. Your doctor may tell you to use ice for 24-48 hours after a flare-up starts. ? Put ice in a plastic bag. ? Place a towel between your skin and the bag. ? Leave the ice on for 20 minutes, 2-3 times a day.  If told, put heat on the painful area as often as told by your doctor. Use the heat source that your doctor recommends, such as a moist heat pack or a heating pad. ? Place a towel between your skin and the heat source. ? Leave the heat on for 20-30 minutes. ? Remove the heat if your skin turns bright red. This is especially important if you are unable to feel pain, heat, or cold. You may have a greater risk of getting burned.  Soak in a warm bath. This can help relieve pain.  Take over-the-counter and prescription medicines only as told by your  doctor. General instructions  Sleep on a firm mattress. Try lying on your side with your knees slightly bent. If you lie on your back, put a pillow under your knees.  Keep all follow-up visits as told by your doctor. This is important. Contact a doctor if:  You have pain that does not get better with rest or medicine. Get help right away if:  One or both of your arms or legs feel weak.  One or both of your arms or legs lose feeling (numbness).  You have trouble controlling when you poop (bowel movement) or pee (urinate).  You feel sick to your stomach (nauseous).  You throw up (vomit).  You have belly (abdominal) pain.  You have shortness of breath.  You pass out (faint). Summary  When back pain lasts longer than 3 months, it is called chronic back pain.  Pain may get worse at certain times (flare-ups).  Use ice and heat as told by your doctor. Your doctor may tell you to use ice after flare-ups. This information is not intended to replace advice given to you by your health care provider. Make sure you discuss any questions you have with your health care provider. Document Released: 09/27/2007 Document Revised: 08/01/2018 Document Reviewed: 11/23/2016 Elsevier Patient Education  2020 [place] 54-30 Years Old, Female Preventive care  refers to visits with your health care provider and lifestyle choices that can promote health and wellness. This includes:  A yearly physical exam. This may also be called an annual well check.  Regular dental visits and eye exams.  Immunizations.  Screening for certain conditions.  Healthy lifestyle choices, such as eating a healthy diet, getting regular exercise, not using drugs or products that contain nicotine and tobacco, and limiting alcohol use. What can I expect for my preventive care visit? Physical exam Your health care provider will check your:  Height and weight. This may be used to calculate body mass  index (BMI), which tells if you are at a healthy weight.  Heart rate and blood pressure.  Skin for abnormal spots. Counseling Your health care provider may ask you questions about your:  Alcohol, tobacco, and drug use.  Emotional well-being.  Home and relationship well-being.  Sexual activity.  Eating habits.  Work and work Statistician.  Method of birth control.  Menstrual cycle.  Pregnancy history. What immunizations do I need?  Influenza (flu) vaccine  This is recommended every year. Tetanus, diphtheria, and pertussis (Tdap) vaccine  You may need a Td booster every 10 years. Varicella (chickenpox) vaccine  You may need this if you have not been vaccinated. Human papillomavirus (HPV) vaccine  If recommended by your health care provider, you may need three doses over 6 months. Measles, mumps, and rubella (MMR) vaccine  You may need at least one dose of MMR. You may also need a second dose. Meningococcal conjugate (MenACWY) vaccine  One dose is recommended if you are age 49-21 years and a first-year college student living in a residence hall, or if you have one of several medical conditions. You may also need additional booster doses. Pneumococcal conjugate (PCV13) vaccine  You may need this if you have certain conditions and were not previously vaccinated. Pneumococcal polysaccharide (PPSV23) vaccine  You may need one or two doses if you smoke cigarettes or if you have certain conditions. Hepatitis A vaccine  You may need this if you have certain conditions or if you travel or work in places where you may be exposed to hepatitis A. Hepatitis B vaccine  You may need this if you have certain conditions or if you travel or work in places where you may be exposed to hepatitis B. Haemophilus influenzae type b (Hib) vaccine  You may need this if you have certain conditions. You may receive vaccines as individual doses or as more than one vaccine together in one  shot (combination vaccines). Talk with your health care provider about the risks and benefits of combination vaccines. What tests do I need?  Blood tests  Lipid and cholesterol levels. These may be checked every 5 years starting at age 30.  Hepatitis C test.  Hepatitis B test. Screening  Diabetes screening. This is done by checking your blood sugar (glucose) after you have not eaten for a while (fasting).  Sexually transmitted disease (STD) testing.  BRCA-related cancer screening. This may be done if you have a family history of breast, ovarian, tubal, or peritoneal cancers.  Pelvic exam and Pap test. This may be done every 3 years starting at age 62. Starting at age 31, this may be done every 5 years if you have a Pap test in combination with an HPV test. Talk with your health care provider about your test results, treatment options, and if necessary, the need for more tests. Follow these instructions at home: Eating and  drinking   Eat a diet that includes fresh fruits and vegetables, whole grains, lean protein, and low-fat dairy.  Take vitamin and mineral supplements as recommended by your health care provider.  Do not drink alcohol if: ? Your health care provider tells you not to drink. ? You are pregnant, may be pregnant, or are planning to become pregnant.  If you drink alcohol: ? Limit how much you have to 0-1 drink a day. ? Be aware of how much alcohol is in your drink. In the [place] one drink equals one 12 oz bottle of beer (355 mL), one 5 oz glass of wine (148 mL), or one 1 oz glass of hard liquor (44 mL). Lifestyle  Take daily care of your teeth and gums.  Stay active. Exercise for at least 30 minutes on 5 or more days each week.  Do not use any products that contain nicotine or tobacco, such as cigarettes, e-cigarettes, and chewing tobacco. If you need help quitting, ask your health care provider.  If you are sexually active, practice safe sex. Use a condom or  other form of birth control (contraception) in order to prevent pregnancy and STIs (sexually transmitted infections). If you plan to become pregnant, see your health care provider for a preconception visit. What's next?  Visit your health care provider once a year for a well check visit.  Ask your health care provider how often you should have your eyes and teeth checked.  Stay up to date on all vaccines. This information is not intended to replace advice given to you by your health care provider. Make sure you discuss any questions you have with your health care provider. Document Released: 06/06/2001 Document Revised: 12/20/2017 Document Reviewed: 12/20/2017 Elsevier Patient Education  2020 [employer].

## 2018-10-21 NOTE — Progress Notes (Signed)
Patient here for annual exam. Patient c/o constant lower back pain x11 years, does stretches and uses heating pad with no relief.

## 2018-10-21 NOTE — Progress Notes (Signed)
ANNUAL PREVENTATIVE CARE GYN  ENCOUNTER NOTE  Subjective:       Rollene FareLisa M Knisely is a 28 y.o. G0P0000 female here for a routine annual gynecologic exam.  Current complaints: 1.  Needs Pap smear  History significant for chronic low back pain requiring medication use.   Denies difficulty breathing or respiratory distress, chest pain, abdominal pain, excessive vaginal bleeding, dysuria, and leg pain or swelling.    Gynecologic History  Patient's last menstrual period was 09/23/2018 (exact date). Period Cycle (Days): 28 Period Duration (Days): 5 Period Pattern: Regular Menstrual Flow: Moderate Menstrual Control: Tampon Dysmenorrhea: (!) Severe Dysmenorrhea Symptoms: Cramping  Contraception: condoms  Last Pap: Unsure. Results were: normal per patient   Obstetric History  OB History  Gravida Para Term Preterm AB Living  0 0 0 0 0 0  SAB TAB Ectopic Multiple Live Births  0 0 0 0 0    Past Medical History:  Diagnosis Date  . Collagen vascular disease (HCC)   . Lupus (HCC)   . Scoliosis    No Known Allergies  Social History   Socioeconomic History  . Marital status: Single    Spouse name: Not on file  . Number of children: Not on file  . Years of education: Not on file  . Highest education level: Not on file  Occupational History  . Not on file  Social Needs  . Financial resource strain: Not on file  . Food insecurity    Worry: Not on file    Inability: Not on file  . Transportation needs    Medical: Not on file    Non-medical: Not on file  Tobacco Use  . Smoking status: Former Smoker    Packs/day: 0.50    Types: Cigarettes    Start date: 2013    Quit date: 01/22/2018    Years since quitting: 0.7  . Smokeless tobacco: Never Used  Substance and Sexual Activity  . Alcohol use: Not Currently  . Drug use: Never  . Sexual activity: Yes    Birth control/protection: Condom  Lifestyle  . Physical activity    Days per week: Not on file    Minutes per session: Not  on file  . Stress: Not on file  Relationships  . Social Musicianconnections    Talks on phone: Not on file    Gets together: Not on file    Attends religious service: Not on file    Active member of club or organization: Not on file    Attends meetings of clubs or organizations: Not on file    Relationship status: Not on file  . Intimate partner violence    Fear of current or ex partner: Not on file    Emotionally abused: Not on file    Physically abused: Not on file    Forced sexual activity: Not on file  Other Topics Concern  . Not on file  Social History Narrative  . Not on file    Family History  Problem Relation Age of Onset  . Hodgkin's lymphoma Mother   . Breast cancer Neg Hx   . Ovarian cancer Neg Hx   . Colon cancer Neg Hx     The following portions of the patient's history were reviewed and updated as appropriate: allergies, current medications, past family history, past medical history, past social history, past surgical history and problem list.  Review of Systems  ROS negative except as noted above. Information obtained from patient.    Objective:  BP 92/68   Pulse 84   Ht 5\' 6"  (1.676 m)   Wt 133 lb 3.2 oz (60.4 kg)   LMP 09/23/2018 (Exact Date)   BMI 21.50 kg/m    CONSTITUTIONAL: Well-developed, well-nourished female in no acute distress.   PSYCHIATRIC: Normal mood and affect. Normal behavior. Normal judgment and thought content.  [place]: Alert and oriented to person, place, and time. Normal muscle tone coordination. No cranial nerve deficit noted.  HENT:  Normocephalic, atraumatic, External right and left ear normal.   EYES: Conjunctivae and EOM are normal. Pupils are equal and round.   NECK: Normal range of motion, supple, no masses.  Normal thyroid.   SKIN: Skin is warm and dry. No rash noted. Not diaphoretic. No erythema. No pallor.  CARDIOVASCULAR: Normal heart rate noted, regular rhythm, no murmur.  RESPIRATORY: Clear to auscultation  bilaterally. Effort and breath sounds normal, no problems with respiration noted.  BREASTS: Symmetric in size. No masses, skin changes, nipple drainage, or lymphadenopathy.  ABDOMEN: Soft, normal bowel sounds, no distention noted.  No tenderness, rebound or guarding.   PELVIC:  External Genitalia: Normal  Vagina: Normal  Cervix: Normal, Pap collected  Uterus: Normal  Adnexa: Normal  MUSCULOSKELETAL: Normal range of motion. No tenderness.  No cyanosis, clubbing, or edema.  2+ distal pulses.  LYMPHATIC: No Axillary, Supraclavicular, or Inguinal Adenopathy.  Assessment:   Annual gynecologic examination 28 y.o.   Contraception: condoms   Normal BMI   Problem List Items Addressed This Visit    None    Visit Diagnoses    Well woman exam    -  Primary   Relevant Orders   Cytology - PAP   Chronic low back pain without sciatica, unspecified back pain laterality       Screening for cervical cancer       Relevant Orders   Cytology - PAP      Plan:   Pap: Pap Co Test  Labs: Declined   Routine preventative health maintenance measures emphasized: Exercise/Diet/Weight control, Tobacco Warnings, Alcohol/Substance use risks, Stress Management, Peer Pressure Issues and Safe Sex; see AVS  Discussed home treatment measures and referral options for management of back pain   Reviewed red flag symptoms and when to call  RTC x 1 year for ANNUAL EXAM or sooner if needed   Diona Fanti, CNM [facility], [facility] 10/21/18 9:50 AM

## 2018-10-28 LAB — CYTOLOGY - PAP
HPV 16/18/45 genotyping: NEGATIVE
HPV: DETECTED — AB

## 2018-10-31 NOTE — Progress Notes (Signed)
She may see Amalia Hailey or Marcelline Mates if she desires. Will you check and see if her Medicaid covers or if she will need to go through [facility]? Thanks, JML

## 2018-10-31 NOTE — Progress Notes (Signed)
Thanks! JML

## 2019-09-15 ENCOUNTER — Encounter: Payer: Self-pay | Admitting: Emergency Medicine

## 2019-09-15 ENCOUNTER — Other Ambulatory Visit: Payer: Self-pay

## 2019-09-15 ENCOUNTER — Emergency Department: Payer: Medicaid Other

## 2019-09-15 ENCOUNTER — Emergency Department
Admission: EM | Admit: 2019-09-15 | Discharge: 2019-09-15 | Disposition: A | Discharge status: Home / Self Care | Payer: Medicaid Other | Attending: Emergency Medicine | Admitting: Emergency Medicine

## 2019-09-15 DIAGNOSIS — K859 Acute pancreatitis without necrosis or infection, unspecified: Secondary | ICD-10-CM | POA: Diagnosis not present

## 2019-09-15 DIAGNOSIS — Z87891 Personal history of nicotine dependence: Secondary | ICD-10-CM | POA: Diagnosis not present

## 2019-09-15 DIAGNOSIS — Z3A00 Weeks of gestation of pregnancy not specified: Secondary | ICD-10-CM | POA: Diagnosis not present

## 2019-09-15 DIAGNOSIS — Z3491 Encounter for supervision of normal pregnancy, unspecified, first trimester: Secondary | ICD-10-CM

## 2019-09-15 DIAGNOSIS — O99611 Diseases of the digestive system complicating pregnancy, first trimester: Secondary | ICD-10-CM | POA: Diagnosis present

## 2019-09-15 LAB — LIPASE, BLOOD: Lipase: 103 U/L — ABNORMAL HIGH (ref 11–51)

## 2019-09-15 LAB — COMPREHENSIVE METABOLIC PANEL
ALT: 15 U/L (ref 0–44)
AST: 14 U/L — ABNORMAL LOW (ref 15–41)
Albumin: 4.2 g/dL (ref 3.5–5.0)
Alkaline Phosphatase: 53 U/L (ref 38–126)
Anion gap: 8 (ref 5–15)
BUN: 7 mg/dL (ref 6–20)
CO2: 26 mmol/L (ref 22–32)
Calcium: 9.6 mg/dL (ref 8.9–10.3)
Chloride: 100 mmol/L (ref 98–111)
Creatinine, Ser: 0.63 mg/dL (ref 0.44–1.00)
GFR calc Af Amer: >60 mL/min (ref >60)
GFR calc non Af Amer: >60 mL/min (ref >60)
Glucose, Bld: 92 mg/dL (ref 70–99)
Potassium: 4.1 mmol/L (ref 3.5–5.1)
Sodium: 134 mmol/L — ABNORMAL LOW (ref 135–145)
Total Bilirubin: 0.7 mg/dL (ref 0.3–1.2)
Total Protein: 7.4 g/dL (ref 6.5–8.1)

## 2019-09-15 LAB — URINALYSIS, COMPLETE (UACMP) WITH MICROSCOPIC
Bacteria, UA: NONE SEEN
Bilirubin Urine: NEGATIVE
Glucose, UA: NEGATIVE mg/dL
Hgb urine dipstick: NEGATIVE
Ketones, ur: NEGATIVE mg/dL
Leukocytes,Ua: NEGATIVE
Nitrite: NEGATIVE
Protein, ur: NEGATIVE mg/dL
Specific Gravity, Urine: 1.02 (ref 1.005–1.030)
pH: 5 (ref 5.0–8.0)

## 2019-09-15 LAB — CBC
HCT: 43.8 % (ref 36.0–46.0)
Hemoglobin: 14.8 g/dL (ref 12.0–15.0)
MCH: 30.2 pg (ref 26.0–34.0)
MCHC: 33.8 g/dL (ref 30.0–36.0)
MCV: 89.4 fL (ref 80.0–100.0)
Platelets: 343 10*3/uL (ref 150–400)
RBC: 4.9 MIL/uL (ref 3.87–5.11)
RDW: 13.2 % (ref 11.5–15.5)
WBC: 10.2 10*3/uL (ref 4.0–10.5)
nRBC: 0 % (ref 0.0–0.2)

## 2019-09-15 LAB — POCT PREGNANCY, URINE: Preg Test, Ur: POSITIVE — AB

## 2019-09-15 LAB — HCG, QUANTITATIVE, PREGNANCY: hCG, Beta Chain, Quant, S: 61280 m[IU]/mL — ABNORMAL HIGH (ref <5)

## 2019-09-15 MED ORDER — ONDANSETRON 4 MG PO TBDP: 4.0000 mg | ORAL_TABLET | Freq: Once | ORAL | Status: DC | PRN

## 2019-09-15 NOTE — ED Notes (Signed)
NAD noted at time of D/C. Pt denies questions or concerns. Pt ambulatory to the lobby at this time.  

## 2019-09-15 NOTE — ED Triage Notes (Signed)
Patient presents to the ED with left upper abdominal pain x 3 days with nausea and vomiting.  Patient reports vomiting x 4 in the past 24 hours.  Patient states pain is relieved with pressure to the area.

## 2019-09-15 NOTE — ED Provider Notes (Signed)
[facility] Emergency Department Provider Note  Time seen: 3:46 PM  I have reviewed the triage vital signs and the nursing notes.   HISTORY  Chief Complaint Abdominal Pain   HPI Michelle Massey is a 29 y.o. female with a past medical history of lupus, presents to the emergency department for right-sided abdominal pain nausea and vomiting.  According to the patient for the past 3 days she has been nauseated with occasional vomiting along with upper and right upper quadrant abdominal pain.  Patient states moderate dull pain in the right upper quadrant.  No fever.  No shortness of breath or chest pain.  Last menstrual period 1 month ago.   Past Medical History:  Diagnosis Date  . Collagen vascular disease (HCC)   . Lupus (HCC)   . Scoliosis     There are no problems to display for this patient.   History reviewed. No pertinent surgical history.  Prior to Admission medications   Not on File    No Known Allergies  Family History  Problem Relation Age of Onset  . Hodgkin's lymphoma Mother   . Breast cancer Neg Hx   . Ovarian cancer Neg Hx   . Colon cancer Neg Hx     Social History Social History   Tobacco Use  . Smoking status: Former Smoker    Packs/day: 0.50    Types: Cigarettes    Start date: 2013    Quit date: 01/22/2018    Years since quitting: 1.6  . Smokeless tobacco: Never Used  Substance Use Topics  . Alcohol use: Not Currently  . Drug use: Never    Review of Systems Constitutional: Negative for fever. Cardiovascular: Negative for chest pain. Respiratory: Negative for shortness of breath. Gastrointestinal: Right upper quadrant abdominal pain.  Positive for nausea vomiting. Genitourinary: Negative for urinary compaints Musculoskeletal: Negative for musculoskeletal complaints Neurological: Negative for headache All other ROS negative  ____________________________________________   PHYSICAL EXAM:  VITAL SIGNS: ED Triage  Vitals  Enc Vitals Group     BP 09/15/19 1414 111/72     Pulse Rate 09/15/19 1414 80     Resp 09/15/19 1414 16     Temp 09/15/19 1414 99.1 F (37.3 C)     Temp Source 09/15/19 1414 Oral     SpO2 09/15/19 1414 100 %     Weight 09/15/19 1414 140 lb (63.5 kg)     Height 09/15/19 1414 5\' 7"  (1.702 m)     Head Circumference --      Peak Flow --      Pain Score 09/15/19 1421 7     Pain Loc --      Pain Edu? --      Excl. in GC? --     Constitutional: Alert and oriented. Well appearing and in no distress. Eyes: Normal exam ENT      Head: Normocephalic and atraumatic.      Mouth/Throat: Mucous membranes are moist. Cardiovascular: Normal rate, regular rhythm. Respiratory: Normal respiratory effort without tachypnea nor retractions. Breath sounds are clear Gastrointestinal: Soft, moderate right upper quadrant and mild epigastric tenderness palpation without rebound guarding or distention. Musculoskeletal: Nontender with normal range of motion in all extremities.  Neurologic:  Normal speech and language. No gross focal neurologic deficits Skin:  Skin is warm, dry and intact.  Psychiatric: Mood and affect are normal.       RADIOLOGY  Negative ultrasound.  ____________________________________________   INITIAL IMPRESSION / ASSESSMENT AND PLAN /  ED COURSE  Pertinent labs & imaging results that were available during my care of the patient were reviewed by me and considered in my medical decision making (see chart for details).   Patient presents emergency department for right upper quadrant abdominal pain nausea vomiting over the past 2 to 3 days.  Patient's labs show an elevated lipase possibly indicating acute pancreatitis.  Patient denies any alcohol use.  She does have moderate right upper quadrant tenderness we will obtain a right upper quadrant ultrasound to evaluate for possible gallstones cholecystitis or possible choledocholithiasis.  Patient's urine pregnancy test has  resulted positive.  Patient states she has not been sexually active in approximately 2 months and had a normal menstrual period last month.  I have added on a quantitative beta-hCG as a precaution.  We will continue to closely monitor the patient.  Patient agreeable to plan of care.  Beta-hCG confirms pregnancy 61,000 quantitative beta-hCG.  I discussed this with the patient.  She states she would like to follow-up with the health department for possible abortion.  Will refer for health department follow-up.  As far as the patient's upper abdominal discomfort I highly suspect this to be due to mild pancreatitis.  Discussed with the patient avoiding all alcohol fatty oily greasy foods for the next 7 to 10 days with plenty of IV hydration.  Patient agreeable to plan of care.  No lower abdominal pain or discomfort.  JACE DOWE was evaluated in Emergency Department on 09/15/2019 for the symptoms described in the history of present illness. She was evaluated in the context of the global COVID-19 pandemic, which necessitated consideration that the patient might be at risk for infection with the SARS-CoV-2 virus that causes COVID-19. Institutional protocols and algorithms that pertain to the evaluation of patients at risk for COVID-19 are in a state of rapid change based on information released by regulatory bodies including the CDC and federal and state organizations. These policies and algorithms were followed during the patient's care in the ED.  ____________________________________________   FINAL CLINICAL IMPRESSION(S) / ED DIAGNOSES  Right upper quadrant abdominal pain Nausea vomiting First trimester pregnancy   Harvest Dark, MD 09/15/19 [id number]

## 2019-10-06 ENCOUNTER — Encounter: Payer: Self-pay | Admitting: Emergency Medicine

## 2019-10-06 ENCOUNTER — Observation Stay
Admission: EM | Admit: 2019-10-06 | Discharge: 2019-10-07 | Disposition: A | Discharge status: Home / Self Care | Payer: Medicaid Other | Attending: Surgery | Admitting: Surgery

## 2019-10-06 ENCOUNTER — Other Ambulatory Visit: Payer: Self-pay

## 2019-10-06 DIAGNOSIS — Z3A09 9 weeks gestation of pregnancy: Secondary | ICD-10-CM | POA: Diagnosis not present

## 2019-10-06 DIAGNOSIS — K3580 Unspecified acute appendicitis: Secondary | ICD-10-CM

## 2019-10-06 DIAGNOSIS — O219 Vomiting of pregnancy, unspecified: Secondary | ICD-10-CM | POA: Diagnosis present

## 2019-10-06 DIAGNOSIS — O26891 Other specified pregnancy related conditions, first trimester: Secondary | ICD-10-CM | POA: Diagnosis not present

## 2019-10-06 DIAGNOSIS — M329 Systemic lupus erythematosus, unspecified: Secondary | ICD-10-CM | POA: Diagnosis not present

## 2019-10-06 DIAGNOSIS — M419 Scoliosis, unspecified: Secondary | ICD-10-CM | POA: Diagnosis not present

## 2019-10-06 DIAGNOSIS — Z20822 Contact with and (suspected) exposure to covid-19: Secondary | ICD-10-CM | POA: Diagnosis not present

## 2019-10-06 DIAGNOSIS — R1030 Lower abdominal pain, unspecified: Secondary | ICD-10-CM

## 2019-10-06 DIAGNOSIS — Z87891 Personal history of nicotine dependence: Secondary | ICD-10-CM | POA: Diagnosis not present

## 2019-10-06 DIAGNOSIS — R1011 Right upper quadrant pain: Secondary | ICD-10-CM

## 2019-10-06 DIAGNOSIS — R1031 Right lower quadrant pain: Secondary | ICD-10-CM | POA: Insufficient documentation

## 2019-10-06 LAB — CBC
HCT: 42.1 % (ref 36.0–46.0)
Hemoglobin: 14.3 g/dL (ref 12.0–15.0)
MCH: 30 pg (ref 26.0–34.0)
MCHC: 34 g/dL (ref 30.0–36.0)
MCV: 88.4 fL (ref 80.0–100.0)
Platelets: 346 10*3/uL (ref 150–400)
RBC: 4.76 MIL/uL (ref 3.87–5.11)
RDW: 13.5 % (ref 11.5–15.5)
WBC: 9.8 10*3/uL (ref 4.0–10.5)
nRBC: 0 % (ref 0.0–0.2)

## 2019-10-06 LAB — URINALYSIS, COMPLETE (UACMP) WITH MICROSCOPIC
Bacteria, UA: NONE SEEN
Bilirubin Urine: NEGATIVE
Glucose, UA: NEGATIVE mg/dL
Hgb urine dipstick: NEGATIVE
Ketones, ur: 80 mg/dL — AB
Nitrite: NEGATIVE
Protein, ur: 30 mg/dL — AB
Specific Gravity, Urine: 1.029 (ref 1.005–1.030)
pH: 5 (ref 5.0–8.0)

## 2019-10-06 LAB — COMPREHENSIVE METABOLIC PANEL
ALT: 24 U/L (ref 0–44)
AST: 22 U/L (ref 15–41)
Albumin: 4.3 g/dL (ref 3.5–5.0)
Alkaline Phosphatase: 48 U/L (ref 38–126)
Anion gap: 11 (ref 5–15)
BUN: 7 mg/dL (ref 6–20)
CO2: 23 mmol/L (ref 22–32)
Calcium: 9.5 mg/dL (ref 8.9–10.3)
Chloride: 100 mmol/L (ref 98–111)
Creatinine, Ser: 0.54 mg/dL (ref 0.44–1.00)
GFR calc Af Amer: >60 mL/min (ref >60)
GFR calc non Af Amer: >60 mL/min (ref >60)
Glucose, Bld: 94 mg/dL (ref 70–99)
Potassium: 3.6 mmol/L (ref 3.5–5.1)
Sodium: 134 mmol/L — ABNORMAL LOW (ref 135–145)
Total Bilirubin: 0.7 mg/dL (ref 0.3–1.2)
Total Protein: 7.7 g/dL (ref 6.5–8.1)

## 2019-10-06 LAB — HCG, QUANTITATIVE, PREGNANCY: hCG, Beta Chain, Quant, S: 110947 m[IU]/mL — ABNORMAL HIGH (ref <5)

## 2019-10-06 LAB — POCT PREGNANCY, URINE: Preg Test, Ur: POSITIVE — AB

## 2019-10-06 LAB — LIPASE, BLOOD: Lipase: 23 U/L (ref 11–51)

## 2019-10-06 MED ORDER — SODIUM CHLORIDE 0.9% FLUSH: 3.0000 mL | Freq: Once | INTRAVENOUS | Status: DC

## 2019-10-06 MED ORDER — LACTATED RINGERS IV BOLUS
1000.0000 mL | Freq: Once | INTRAVENOUS | Status: AC
  Administered 2019-10-07: 1000 mL via INTRAVENOUS

## 2019-10-06 MED ORDER — METOCLOPRAMIDE HCL 5 MG/ML IJ SOLN
10.0000 mg | Freq: Once | INTRAMUSCULAR | Status: AC
  Administered 2019-10-07: 10 mg via INTRAVENOUS
  Filled 2019-10-06: qty 2

## 2019-10-06 MED ORDER — ACETAMINOPHEN 500 MG PO TABS
1000.0000 mg | ORAL_TABLET | Freq: Once | ORAL | Status: AC
  Administered 2019-10-07: 1000 mg via ORAL
  Filled 2019-10-06: qty 2

## 2019-10-06 NOTE — ED Triage Notes (Signed)
Pt reports RUQ pain that started today accompanied by nausea and vomited about 20 times per pt. Pt talks in complete sentences no respiratory distress noted

## 2019-10-07 ENCOUNTER — Emergency Department: Payer: Medicaid Other

## 2019-10-07 DIAGNOSIS — R1031 Right lower quadrant pain: Secondary | ICD-10-CM | POA: Diagnosis present

## 2019-10-07 LAB — SARS CORONAVIRUS 2 BY RT PCR (HOSPITAL ORDER, PERFORMED IN ~~LOC~~ HOSPITAL LAB): SARS Coronavirus 2: NEGATIVE

## 2019-10-07 MED ORDER — PRENATAL PLUS 27-1 MG PO TABS
1.0000 | ORAL_TABLET | Freq: Every day | ORAL | Status: DC
  Filled 2019-10-07: qty 1

## 2019-10-07 MED ORDER — PIPERACILLIN-TAZOBACTAM 3.375 G IVPB: 3.3750 g | Freq: Three times a day (TID) | INTRAVENOUS | Status: DC

## 2019-10-07 MED ORDER — SODIUM CHLORIDE 0.9 % IV SOLN: INTRAVENOUS | Status: DC

## 2019-10-07 MED ORDER — PIPERACILLIN-TAZOBACTAM 3.375 G IVPB 30 MIN
3.3750 g | Freq: Once | INTRAVENOUS | Status: AC
  Administered 2019-10-07: 3.375 g via INTRAVENOUS
  Filled 2019-10-07: qty 50

## 2019-10-07 MED ORDER — HYDROCODONE-ACETAMINOPHEN 5-325 MG PO TABS: 1.0000 | ORAL_TABLET | ORAL | Status: DC | PRN

## 2019-10-07 MED ORDER — PIPERACILLIN-TAZOBACTAM 3.375 G IVPB
3.3750 g | Freq: Three times a day (TID) | INTRAVENOUS | Status: DC
  Administered 2019-10-07: 3.375 g via INTRAVENOUS
  Filled 2019-10-07 (×6): qty 50

## 2019-10-07 MED ORDER — LORAZEPAM 2 MG/ML IJ SOLN
1.0000 mg | Freq: Once | INTRAMUSCULAR | Status: AC
  Administered 2019-10-07: 1 mg via INTRAVENOUS
  Filled 2019-10-07: qty 1

## 2019-10-07 MED ORDER — MORPHINE SULFATE (PF) 2 MG/ML IV SOLN: 1.0000 mg | INTRAVENOUS | Status: DC | PRN

## 2019-10-07 NOTE — H&P (Signed)
Patient ID: Michelle Massey, female   DOB: 06/13/1990, 29 y.o.   MRN: [id number]  Chief Complaint: Right lower quadrant pain  History of Present Illness Michelle Massey is a 29 y.o. female with history of right lower quadrant pain that began earlier yesterday and was progressive, pain seemingly resolved following her admission to the ED.  However she was felt to be consistently tender in the right lower quadrant.  She is known to be [redacted] weeks pregnant.  Associated nausea and nonbilious vomiting.  She denies any vaginal bleeding, diarrhea, fever, chills.  An MRI was obtained which reportedly shows an enlarged appendix without remarkable inflammatory change.  Her white count is normal and her ultrasound confirmed a 9-week pregnancy, otherwise unremarkable regarding appendicitis. She denied having any pain medications, was closing her eyes frequently, I believe resting during my interview.   She was medicated for her claustrophobia to assist her with her MRI examination.   She does not appear to be in any discomfort this morning. She has had Zosyn IV.  Past Medical History Past Medical History:  Diagnosis Date  . Collagen vascular disease (HCC)   . Lupus (HCC)   . Scoliosis       History reviewed. No pertinent surgical history.  No Known Allergies  Current Facility-Administered Medications  Medication Dose Route Frequency Provider Last Rate Last Admin  . sodium chloride flush (NS) 0.9 % injection 3 mL  3 mL Intravenous Once Nita Sickle, MD       Current Outpatient Medications  Medication Sig Dispense Refill  . Prenatal Vit-Fe Fumarate-FA (PRENATAL MULTIVITAMIN) TABS tablet Take 1 tablet by mouth daily at 12 noon.      Family History Family History  Problem Relation Age of Onset  . Hodgkin's lymphoma Mother   . Breast cancer Neg Hx   . Ovarian cancer Neg Hx   . Colon cancer Neg Hx       Social History Social History   Tobacco Use  . Smoking status: Former Smoker    Packs/day:  0.50    Types: Cigarettes    Start date: 2013    Quit date: 01/22/2018    Years since quitting: 1.7  . Smokeless tobacco: Never Used  Vaping Use  . Vaping Use: Never used  Substance Use Topics  . Alcohol use: Not Currently  . Drug use: Never        Review of Systems  Constitutional: Negative for chills and fever.  HENT: Negative for ear pain.   Eyes: Negative.   Respiratory: Negative for cough and shortness of breath.   Cardiovascular: Negative for chest pain.  Gastrointestinal: Negative for diarrhea.  Genitourinary: Negative for dysuria.  Musculoskeletal: Negative for back pain.  Skin: Negative for itching and rash.  Neurological: Negative for focal weakness.  Endo/Heme/Allergies: Does not bruise/bleed easily.      Physical Exam Blood pressure (!) 90/51, pulse 66, temperature 98.6 F (37 C), temperature source Oral, resp. rate 15, height 5\' 7"  (1.702 m), weight 63.5 kg, last menstrual period 08/11/2019, SpO2 99 %. Last Weight  Most recent update: 10/06/2019  8:47 PM   Weight  63.5 kg (140 lb)            CONSTITUTIONAL: Well developed, and nourished, appropriately responsive and aware without distress.   EYES: Sclera non-icteric.   EARS, NOSE, MOUTH AND THROAT: Mask worn.    Hearing is intact to voice.  NECK: Trachea is midline, and there is no jugular venous distension.  LYMPH  NODES:  Lymph nodes in the neck are not enlarged. RESPIRATORY:  Lungs are clear, and breath sounds are equal bilaterally. Normal respiratory effort without pathologic use of accessory muscles. CARDIOVASCULAR: Heart is regular in rate and rhythm. GI: The abdomen is soft, nontender, and nondistended. There were no palpable masses. There were normal bowel sounds. MUSCULOSKELETAL:  Symmetrical muscle tone appreciated in all four extremities.    SKIN: Skin turgor is normal. No pathologic skin lesions appreciated.  NEUROLOGIC:  Motor and sensation appear grossly normal.  Cranial nerves are grossly  without defect. PSYCH:  Alert and oriented to person, place and time. Affect is appropriate for situation.  Data Reviewed I have personally reviewed what is currently available of the patient's imaging, recent labs and medical records.   Labs:  CBC Latest Ref Rng & Units 10/06/2019 09/15/2019 07/11/2018  WBC 4.0 - 10.5 K/uL 9.8 10.2 8.2  Hemoglobin 12.0 - 15.0 g/dL 41.9 37.9 02.4  Hematocrit 36 - 46 % 42.1 43.8 42.4  Platelets 150 - 400 K/uL 346 343 310   CMP Latest Ref Rng & Units 10/06/2019 09/15/2019 07/11/2018  Glucose 70 - 99 mg/dL 94 92 98  BUN 6 - 20 mg/dL 7 7 10   Creatinine 0.44 - 1.00 mg/dL 0.97 3.53  Sodium 135 - 145 mmol/L 134(L) 134(L) 137  Potassium 3.5 - 5.1 mmol/L 3.6 4.1 3.5  Chloride 98 - 111 mmol/L 100 100 104  CO2 22 - 32 mmol/L 23 26 26   Calcium 8.9 - 10.3 mg/dL 9.5 9.6 2.99)  Total Protein 6.5 - 8.1 g/dL 7.7 7.4 -  Total Bilirubin 0.3 - 1.2 mg/dL 0.7 0.7 -  Alkaline Phos 38 - 126 U/L 48 53 -  AST 15 - 41 U/L 22 14(L) -  ALT 0 - 44 U/L 24 15 -      Imaging: Radiology review:  Within last 24 hrs: OB LESS THAN 14 WEEKS WITH OB TRANSVAGINAL  Result Date: 10/07/2019 CLINICAL DATA:  Initial evaluation for acute lower abdominal pain, early pregnancy. EXAM: OBSTETRIC <14 WK [place] AND TRANSVAGINAL OB 10/09/2019 TECHNIQUE: Both transabdominal and transvaginal ultrasound examinations were performed for complete evaluation of the gestation as well as the maternal uterus, adnexal regions, and pelvic cul-de-sac. Transvaginal technique was performed to assess early pregnancy. COMPARISON:  None available. FINDINGS: Intrauterine gestational sac: Single Yolk sac:  Present Embryo:  Present Cardiac Activity: Present Heart Rate: 167 bpm CRL: 28.1 mm   9 w   4 d                  [place] EDC: 05/07/2020 Subchorionic hemorrhage:  None visualized. Maternal uterus/adnexae: Ovaries are normal in appearance bilaterally. Corpus luteal cyst measuring 1.9 x 1.7 x 2.1 cm noted on the left. No free fluid  within the pelvis. Echogenic debris/material seen within the partially visualized bladder lumen. IMPRESSION: 1. Single viable intrauterine pregnancy as above, estimated gestational age [redacted] weeks and 4 days by crown-rump length, with ultrasound EDC of 05/07/2020. No complication. 2. Echogenic material/debris within the bladder lumen, nonspecific. Correlation with urinalysis recommended. 3. 2.1 cm left ovarian corpus luteal cyst. No other acute abnormality within the pelvis. Electronically Signed   By: 05/09/2020 M.D.   On: 10/07/2019 02:24   [facility] ABDOMEN LIMITED RUQ  Result Date: 10/07/2019 CLINICAL DATA:  Right upper quadrant pain EXAM: ULTRASOUND ABDOMEN LIMITED RIGHT UPPER QUADRANT COMPARISON:  09/15/2019 FINDINGS: Gallbladder: No gallstones or wall thickening visualized. No sonographic Murphy sign noted by sonographer. Common bile  duct: Diameter: Normal caliber, 4 mm Liver: No focal lesion identified. Within normal limits in parenchymal echogenicity. Portal vein is patent on color Doppler imaging with normal direction of blood flow towards the liver. Other: None. IMPRESSION: Normal study. Electronically Signed   By: Rolm Baptise M.D.   On: 10/07/2019 02:15   MRI: Pended report pending. Assessment    Right lower quadrant pain/tenderness. Intrauterine pregnancy 9 weeks by ultrasound. There are no problems to display for this patient.   Plan    She is already had 1 dose of antibiotics, I doubt this is contributing to her pain relief.  At worst were catching her appendicitis early, will admit for observation, continue IV antibiotics.  Rediscuss risk-benefit ratio for surgery or continued antibiotic treatment later today.  Face-to-face time spent with the patient and accompanying care providers(if present) was 30 minutes, with more than 50% of the time spent counseling, educating, and coordinating care of the patient.      Ronny Bacon M.D., FACS 10/07/2019, 7:33 AM

## 2019-10-07 NOTE — Discharge Summary (Signed)
DC order received from Dr. Claudine Mouton via telephone. Dc instructions and follow up appt reviewed with pt. Pt verbalizes understanding of all dc instructions. IV removed. pt already dressed and dc'd to home via wc by CNA.

## 2019-10-07 NOTE — ED Notes (Signed)
Pt transported to MRI 

## 2019-10-07 NOTE — Discharge Instructions (Signed)
Call for temp above 100.4, persistent nausea and vomiting, or uncontrollable pain

## 2019-10-07 NOTE — ED Notes (Signed)
Pt transported to US

## 2019-10-07 NOTE — ED Notes (Signed)
Moved to rm 33 by stretcher.  Patient in nad.  She says her iv is bothering her and would like it changed to a different location.

## 2019-10-07 NOTE — Discharge Summary (Signed)
  Physician Discharge Summary  Patient ID: Michelle Massey MRN: [id number] DOB/AGE: 11-27-1990 28 y.o.  Admit date: 10/06/2019 Discharge date: 10/07/2019  Admission Diagnoses: Right lower quadrant pain, intrauterine pregnancy.  Discharge Diagnoses:  Active Problems:   RLQ abdominal pain   Discharged Condition: good  Hospital Course: Patient was admitted for observation, received doses of IV Zosyn, with significantly better which could not be attributed to the antibiotic alone.  Wanted to eat dinner and be discharged same day.  She tolerated her meal and went home as per her request.  Consults: None  Significant Diagnostic Studies: Abdominal MRI, lab work.  Treatments: antibiotics: Zosyn  Discharge Exam: Blood pressure (!) 106/52, pulse 74, temperature 99 F (37.2 C), temperature source Oral, resp. rate 18, height 5\' 7"  (1.702 m), weight 63.5 kg, last menstrual period 08/11/2019, SpO2 99 %. GI: soft, non-tender; bowel sounds normal; no masses,  no organomegaly  Disposition: Discharge disposition: 01-Home or Self Care       Discharge Instructions     Call MD for:  persistant nausea and vomiting   Complete by: As directed    Call MD for:  severe uncontrolled pain   Complete by: As directed    Call MD for:  temperature >100.4   Complete by: As directed    Diet - low sodium heart healthy   Complete by: As directed    Increase activity slowly   Complete by: As directed       Allergies as of 10/07/2019   No Known Allergies      Medication List     TAKE these medications    prenatal multivitamin Tabs tablet Take 1 tablet by mouth daily at 12 noon.          Signed: 10/09/2019, M.D., [facility] 10/07/2019, 7:38 PM

## 2019-10-07 NOTE — ED Provider Notes (Signed)
[facility] Emergency Department Provider Note  ____________________________________________  Time seen: Approximately 2:27 AM  I have reviewed the triage vital signs and the nursing notes.   HISTORY  Chief Complaint Nausea and Emesis   HPI Michelle Massey is a 29 y.o. female with a history of lupus, G1 P0 currently at [redacted] weeks gestational age per LMP who presents for evaluation of abdominal pain, nausea and vomiting.  Patient reports that her symptoms started earlier today.  Pain is located on the right side of her abdomen, sharp, started after lunch.  She had had nausea for most of the day preceding the pain.  She has had a few episodes of nonbloody nonbilious emesis.  No diarrhea, no fever chills, no chest pain or shortness of breath.  Patient denies any prior abdominal surgeries.  Has not established care for this pregnancy yet.  No vaginal bleeding or vaginal discharge.  Pain is moderate in intensity and constant.   Past Medical History:  Diagnosis Date  . Collagen vascular disease (HCC)   . Lupus (HCC)   . Scoliosis     There are no problems to display for this patient.   History reviewed. No pertinent surgical history.  Prior to Admission medications   Not on File    Allergies Patient has no known allergies.  Family History  Problem Relation Age of Onset  . Hodgkin's lymphoma Mother   . Breast cancer Neg Hx   . Ovarian cancer Neg Hx   . Colon cancer Neg Hx     Social History Social History   Tobacco Use  . Smoking status: Former Smoker    Packs/day: 0.50    Types: Cigarettes    Start date: 2013    Quit date: 01/22/2018    Years since quitting: 1.7  . Smokeless tobacco: Never Used  Vaping Use  . Vaping Use: Never used  Substance Use Topics  . Alcohol use: Not Currently  . Drug use: Never    Review of Systems  Constitutional: Negative for fever. Eyes: Negative for visual changes. ENT: Negative for sore throat. Neck: No neck  pain  Cardiovascular: Negative for chest pain. Respiratory: Negative for shortness of breath. Gastrointestinal: + R sided abdominal pain, nausea, and vomiting. No diarrhea. Genitourinary: Negative for dysuria. Musculoskeletal: Negative for back pain. Skin: Negative for rash. Neurological: Negative for headaches, weakness or numbness. Psych: No SI or HI  ____________________________________________   PHYSICAL EXAM:  VITAL SIGNS: ED Triage Vitals  Enc Vitals Group     BP 10/06/19 2047 128/80     Pulse Rate 10/06/19 2047 (!) 102     Resp 10/06/19 2047 18     Temp 10/06/19 2047 98.6 F (37 C)     Temp Source 10/06/19 2047 Oral     SpO2 10/06/19 2047 99 %     Weight 10/06/19 2047 140 lb (63.5 kg)     Height 10/06/19 2047 5\' 7"  (1.702 m)     Head Circumference --      Peak Flow --      Pain Score 10/06/19 2050 8     Pain Loc --      Pain Edu? --      Excl. in GC? --     Constitutional: Alert and oriented. Well appearing and in no apparent distress. HEENT:      Head: Normocephalic and atraumatic.         Eyes: Conjunctivae are normal. Sclera is non-icteric.  Mouth/Throat: Mucous membranes are moist.       Neck: Supple with no signs of meningismus. Cardiovascular: Regular rate and rhythm. No murmurs, gallops, or rubs.  Respiratory: Normal respiratory effort. Lungs are clear to auscultation bilaterally.  Gastrointestinal: Soft, tender to palpation on the right side with negative Murphy sign, and non distended with positive bowel sounds. No rebound or guarding. Genitourinary: No CVA tenderness. Musculoskeletal: Nontender with normal range of motion in all extremities. No edema, cyanosis, or erythema of extremities. Neurologic: Normal speech and language. Face is symmetric. Moving all extremities. No gross focal neurologic deficits are appreciated. Skin: Skin is warm, dry and intact. No rash noted. Psychiatric: Mood and affect are normal. Speech and behavior are  normal.  ____________________________________________   LABS (all labs ordered are listed, but only abnormal results are displayed)  Labs Reviewed  COMPREHENSIVE METABOLIC PANEL - Abnormal; Notable for the following components:      Result Value   Sodium 134 (*)    All other components within normal limits  URINALYSIS, COMPLETE (UACMP) WITH MICROSCOPIC - Abnormal; Notable for the following components:   Color, Urine AMBER (*)    APPearance CLOUDY (*)    Ketones, ur 80 (*)    Protein, ur 30 (*)    Leukocytes,Ua TRACE (*)    All other components within normal limits  HCG, QUANTITATIVE, PREGNANCY - Abnormal; Notable for the following components:   hCG, Beta Chain, Quant, S 110,947 (*)    All other components within normal limits  POCT PREGNANCY, URINE - Abnormal; Notable for the following components:   Preg Test, Ur POSITIVE (*)    All other components within normal limits  SARS CORONAVIRUS 2 BY RT PCR (HOSPITAL ORDER, [place] [facility])  LIPASE, BLOOD  CBC  POC URINE PREG, ED   ____________________________________________  EKG  none  ____________________________________________  RADIOLOGY  I have personally reviewed the images performed during this visit and I agree with the Radiologist's read.   Interpretation by Radiologist:  US OB LESS THAN 14 WEEKS WITH OB TRANSVAGINAL  Result Date: 10/07/2019 CLINICAL DATA:  Initial evaluation for acute lower abdominal pain, early pregnancy. EXAM: OBSTETRIC <14 WK [place] AND TRANSVAGINAL OB US TECHNIQUE: Both transabdominal and transvaginal ultrasound examinations were performed for complete evaluation of the gestation as well as the maternal uterus, adnexal regions, and pelvic cul-de-sac. Transvaginal technique was performed to assess early pregnancy. COMPARISON:  None available. FINDINGS: Intrauterine gestational sac: Single Yolk sac:  Present Embryo:  Present Cardiac Activity: Present Heart Rate: 167 bpm CRL: 28.1 mm    9 w   4 d                  [place] EDC: 05/07/2020 Subchorionic hemorrhage:  None visualized. Maternal uterus/adnexae: Ovaries are normal in appearance bilaterally. Corpus luteal cyst measuring 1.9 x 1.7 x 2.1 cm noted on the left. No free fluid within the pelvis. Echogenic debris/material seen within the partially visualized bladder lumen. IMPRESSION: 1. Single viable intrauterine pregnancy as above, estimated gestational age [redacted] weeks and 4 days by crown-rump length, with ultrasound EDC of 05/07/2020. No complication. 2. Echogenic material/debris within the bladder lumen, nonspecific. Correlation with urinalysis recommended. 3. 2.1 cm left ovarian corpus luteal cyst. No other acute abnormality within the pelvis. Electronically Signed   By: Jeannine Boga M.D.   On: 10/07/2019 02:24   US ABDOMEN LIMITED RUQ  Result Date: 10/07/2019 CLINICAL DATA:  Right upper quadrant pain EXAM: ULTRASOUND ABDOMEN LIMITED RIGHT  UPPER QUADRANT COMPARISON:  09/15/2019 FINDINGS: Gallbladder: No gallstones or wall thickening visualized. No sonographic Murphy sign noted by sonographer. Common bile duct: Diameter: Normal caliber, 4 mm Liver: No focal lesion identified. Within normal limits in parenchymal echogenicity. Portal vein is patent on color Doppler imaging with normal direction of blood flow towards the liver. Other: None. IMPRESSION: Normal study. Electronically Signed   By: Charlett Nose M.D.   On: 10/07/2019 02:15     ____________________________________________   PROCEDURES  Procedure(s) performed:yes .1-3 Lead EKG Interpretation Performed by: Nita Sickle, MD Authorized by: Nita Sickle, MD     Interpretation: normal     ECG rate assessment: normal     Rhythm: sinus rhythm     Ectopy: none     Critical Care performed:  None ____________________________________________   INITIAL IMPRESSION / ASSESSMENT AND PLAN / ED COURSE  29 y.o. female with a history of lupus, G1 P0 currently at [redacted]  weeks gestational age per LMP who presents for evaluation of R sided abdominal pain, nausea and vomiting that started earlier today.  Patient is well-appearing in no distress, she is tender to palpation on the right side with negative Murphy sign, no rebound or guarding, no flank tenderness.  Differential diagnosis including gallbladder pathology versus appendicitis versus ectopic pregnancy versus pyelonephritis versus kidney stone.  Right upper quadrant ultrasound showing normal gallbladder, transvaginal ultrasound showing normal single IUP, both studies were confirmed by radiology.  Chemistry panel with LFTs and lipase are within normal limits.  UA with no evidence of blood or UTI. Normal white count.  Patient was given IV Reglan and fluids for nausea vomiting and Tylenol for pain.  We will proceed with an MRI to rule out appendicitis. Old medical records reviewed.     _________________________ 3:59 AM on 10/07/2019 -----------------------------------------  Spoke with the radiologist who says the appendix is dilated on MRI measuring 10 mm but no significant inflammation around it.  No other acute findings.  Patient remains tender to palpation now mostly on the right lower quadrant.  Will discuss with surgery.  _________________________ 5:38 AM on 10/07/2019 -----------------------------------------  Spoke with Dr. Claudine Mouton from surgery who will come to evaluate patient emergency room for admission for IV antibiotics and monitoring versus surgery since patient is on her first trimester pregnancy.  She continues to be tender to palpation on the right lower quadrant, but with a benign abdominal exam otherwise with no signs of peritonitis.Marland Kitchen  He agrees with antibiotics.  IV Zosyn has been ordered.  _____________________________________________ Please note:  Patient was evaluated in Emergency Department today for the symptoms described in the history of present illness. Patient was evaluated in the  context of the global COVID-19 pandemic, which necessitated consideration that the patient might be at risk for infection with the SARS-CoV-2 virus that causes COVID-19. Institutional protocols and algorithms that pertain to the evaluation of patients at risk for COVID-19 are in a state of rapid change based on information released by regulatory bodies including the CDC and federal and state organizations. These policies and algorithms were followed during the patient's care in the ED.  Some ED evaluations and interventions may be delayed as a result of limited staffing during the pandemic.   [place] Controlled Substance Database was reviewed by me. ____________________________________________   FINAL CLINICAL IMPRESSION(S) / ED DIAGNOSES   Final diagnoses:  RUQ abdominal pain  Acute appendicitis complicating pregnancy      NEW MEDICATIONS STARTED DURING THIS VISIT:  ED Discharge Orders  None       Note:  This document was prepared using Dragon voice recognition software and may include unintentional dictation errors.    Don Perking, [place], MD 10/07/19 [phone]

## 2019-10-21 ENCOUNTER — Telehealth: Payer: Self-pay | Admitting: Certified Nurse Midwife

## 2019-10-21 NOTE — Telephone Encounter (Signed)
SS wanted to me to call the pt. The pt has an appt for a phy. But it says in here chart she is 10 weeks. I asked the pt if she is going to do her prenatal care here or at the HD. The pt stated that she is going to the HD. The pt verbally understood.

## 2019-10-23 ENCOUNTER — Encounter: Payer: Medicaid Other | Admitting: Certified Nurse Midwife

## 2019-11-03 ENCOUNTER — Other Ambulatory Visit: Payer: Self-pay

## 2019-11-03 ENCOUNTER — Ambulatory Visit: Payer: Medicaid Other | Admitting: Advanced Practice Midwife

## 2019-11-03 NOTE — Progress Notes (Signed)
Patient not seen. Rescheduled to 11/05/2019.Marland KitchenBurt Knack, RN

## 2019-11-04 ENCOUNTER — Telehealth: Payer: Self-pay

## 2019-11-04 NOTE — Telephone Encounter (Signed)
Attempt x 2-no answer, left voicemail message with call back# Sharlette Dense, RN

## 2019-11-04 NOTE — Telephone Encounter (Signed)
Has new ob appt 11/05/19-per RN phobe interview client with Lupus dg; per Hazle Coca, CNM if has Lupus will need HROB care provider; attempted to call-no answer, left voicemail message Sharlette Dense, RN

## 2019-11-04 NOTE — Telephone Encounter (Signed)
Reached client-confirms has history of Lupus; informed, per E. Sciora, CNM that she will need HROB and we are unable to provider prenatal care at [facility].  Does have Medicaid and may call one of local obs; agrees to plan Michelle Dense, RN

## 2019-12-01 ENCOUNTER — Other Ambulatory Visit: Payer: Self-pay

## 2019-12-01 ENCOUNTER — Encounter: Payer: Self-pay | Admitting: Obstetrics & Gynecology

## 2019-12-01 ENCOUNTER — Ambulatory Visit (INDEPENDENT_AMBULATORY_CARE_PROVIDER_SITE_OTHER): Payer: Medicaid Other | Admitting: Obstetrics & Gynecology

## 2019-12-01 ENCOUNTER — Other Ambulatory Visit (HOSPITAL_COMMUNITY)
Admission: RE | Admit: 2019-12-01 | Discharge: 2019-12-01 | Disposition: A | Discharge status: Home / Self Care | Payer: Medicaid Other | Source: Ambulatory Visit | Attending: Obstetrics & Gynecology | Admitting: Obstetrics & Gynecology

## 2019-12-01 VITALS — BP 100/60 | Wt 161.0 lb

## 2019-12-01 DIAGNOSIS — Z124 Encounter for screening for malignant neoplasm of cervix: Secondary | ICD-10-CM

## 2019-12-01 DIAGNOSIS — Z3A17 17 weeks gestation of pregnancy: Secondary | ICD-10-CM

## 2019-12-01 DIAGNOSIS — L932 Other local lupus erythematosus: Secondary | ICD-10-CM | POA: Insufficient documentation

## 2019-12-01 DIAGNOSIS — M329 Systemic lupus erythematosus, unspecified: Secondary | ICD-10-CM | POA: Insufficient documentation

## 2019-12-01 DIAGNOSIS — O99891 Other specified diseases and conditions complicating pregnancy: Secondary | ICD-10-CM

## 2019-12-01 DIAGNOSIS — Z369 Encounter for antenatal screening, unspecified: Secondary | ICD-10-CM

## 2019-12-01 DIAGNOSIS — Z3492 Encounter for supervision of normal pregnancy, unspecified, second trimester: Secondary | ICD-10-CM

## 2019-12-01 DIAGNOSIS — O0990 Supervision of high risk pregnancy, unspecified, unspecified trimester: Secondary | ICD-10-CM | POA: Insufficient documentation

## 2019-12-01 LAB — OB RESULTS CONSOLE VARICELLA ZOSTER ANTIBODY, IGG: Varicella: IMMUNE

## 2019-12-01 NOTE — Patient Instructions (Signed)

## 2019-12-01 NOTE — Progress Notes (Signed)
12/01/2019   Chief Complaint: Missed period  Transfer of Care Patient: no  History of Present Illness: Michelle Massey is a 29 y.o. G1P0000 [redacted]w[redacted]d based on Patient's last menstrual period was 09/15/2019. with an Estimated Date of Delivery: 05/07/20, with the above CC.   Her periods were: reg but poorly remembered    [place] [facility] at 9 weeks showed singleton IUP in June, [facility] based on this [place] She was using no method when she conceived.  She has Positive signs or symptoms of nausea/vomiting of pregnancy. She has Negative signs or symptoms of miscarriage or preterm labor She identifies Negative Zika risk factors for her and her partner On any different medications around the time she conceived/early pregnancy: No  History of varicella: Yes   ROS: A 12-point review of systems was performed and negative, except as stated in the above HPI.  OBGYN History: As per HPI. OB History  Gravida Para Term Preterm AB Living  1 0 0 0 0 0  SAB TAB Ectopic Multiple Live Births  0 0 0 0 0    # Outcome Date GA Lbr Len/2nd Weight Sex Delivery Anes PTL Lv  1 Current             Any issues with any prior pregnancies: not applicable Any prior children are healthy, doing well, without any problems or issues: not applicable History of pap smears: Yes. Last pap smear normal History of STIs: No   Past Medical History: Past Medical History:  Diagnosis Date  . Collagen vascular disease (HCC)   . Lupus (HCC)   . Scoliosis     Past Surgical History: History reviewed. No pertinent surgical history.  Family History:  Family History  Problem Relation Age of Onset  . Hodgkin's lymphoma Mother   . Hypertension Mother   . Cancer Mother   . Asthma Mother   . Bipolar disorder Sister   . Schizophrenia Sister   . Diabetes Maternal Great-grandfather   . Hypertension Maternal Great-grandfather   . Breast cancer Neg Hx   . Ovarian cancer Neg Hx   . Colon cancer Neg Hx    She denies any female cancers, bleeding or  blood clotting disorders.  She denies any history of mental retardation, birth defects or genetic disorders in her or the FOB's history  Social History:  Social History   Socioeconomic History  . Marital status: Single    Spouse name: Not on file  . Number of children: 0  . Years of education: 10-11  . Highest education level: Not on file  Occupational History  . Not on file  Tobacco Use  . Smoking status: Former Smoker    Packs/day: 0.50    Types: Cigarettes    Start date: 2013    Quit date: 01/22/2018    Years since quitting: 1.8  . Smokeless tobacco: Never Used  Vaping Use  . Vaping Use: Never used  Substance and Sexual Activity  . Alcohol use: Not Currently  . Drug use: Yes    Types: Marijuana  . Sexual activity: Yes    Birth control/protection: Condom  Other Topics Concern  . Not on file  Social History Narrative  . Not on file   Social Determinants of Health   Financial Resource Strain:   . Difficulty of Paying Living Expenses:   Food Insecurity:   . Worried About Programme researcher, broadcasting/film/video in the Last Year:   . Barista in the Last Year:   [employer]  Needs:   . Lack of Transportation (Medical):   Marland Kitchen Lack of Transportation (Non-Medical):   Physical Activity:   . Days of Exercise per Week:   . Minutes of Exercise per Session:   Stress:   . Feeling of Stress :   Social Connections:   . Frequency of Communication with Friends and Family:   . Frequency of Social Gatherings with Friends and Family:   . Attends Religious Services:   . Active Member of Clubs or Organizations:   . Attends Banker Meetings:   Marland Kitchen Marital Status:   Intimate Partner Violence:   . Fear of Current or Ex-Partner:   . Emotionally Abused:   Marland Kitchen Physically Abused:   . Sexually Abused:    Any pets in the household: no  Allergy: No Known Allergies  Current Outpatient Medications:  Current Outpatient Medications:  .  Prenatal Vit-Fe Fumarate-FA (PRENATAL  MULTIVITAMIN) TABS tablet, Take 1 tablet by mouth daily at 12 noon., Disp: , Rfl:    Physical Exam:   BP 100/60   Wt 161 lb (73 kg)   LMP 09/15/2019   Breastfeeding Unknown   BMI 25.22 kg/m  Body mass index is 25.22 kg/m. Constitutional: Well nourished, well developed female in no acute distress.  Neck:  Supple, normal appearance, and no thyromegaly  Cardiovascular: S1, S2 normal, no murmur, rub or gallop, regular rate and rhythm Respiratory:  Clear to auscultation bilateral. Normal respiratory effort Abdomen: positive bowel sounds and no masses, hernias; diffusely non tender to palpation, non distended Breasts: breasts appear normal, no suspicious masses, no skin or nipple changes or axillary nodes. Neuro/Psych:  Normal mood and affect.  Skin:  Warm and dry.  Lymphatic:  No inguinal lymphadenopathy.   Pelvic exam: is not limited by body habitus EGBUS: within normal limits, Vagina: within normal limits and with no blood in the vault, Cervix: normal appearing cervix without discharge or lesions, closed/long/high, Uterus:  enlarged: 17 weeks, and Adnexa:  no mass, fullness, tenderness  Assessment: Michelle Massey is a 29 y.o. G1P0000 [redacted]w[redacted]d based on [place] at 9 weeks with an Estimated Date of Delivery: 05/07/20,  for prenatal care.  Plan:  1) Avoid alcoholic beverages. 2) Patient encouraged not to smoke. So far has been able to do this on her own.  Also encouraged no marijuana. 3) Discontinue the use of all non-medicinal drugs and chemicals.  4) Take prenatal vitamins daily.  5) Seatbelt use advised 6) Nutrition, food safety (fish, cheese advisories, and high nitrite foods) and exercise discussed. 7) Hospital and practice style delivering at [facility] discussed  8) Patient is asked about travel to areas at risk for the Zika virus, and counseled to avoid travel and exposure to mosquitoes or sexual partners who may have themselves been exposed to the virus. Testing is discussed, and will be ordered as  appropriate.  9) Childbirth classes at [facility] advised 10) Genetic Screening, such as with 1st Trimester Screening, cell free fetal DNA, AFP testing, and Ultrasound, as well as with amniocentesis and CVS as appropriate, is discussed with patient. She plans to have genetic testing this pregnancy. 11) FOB not involved 12) [place] Anat 19 weeks 13) Lupus, no meds (ever).  Low risk to pregnancy.  Will check Antibody levels.  Problem list reviewed and updated.  Annamarie Major, MD, [facility], [facility] 12/01/2019  9:44 AM

## 2019-12-02 LAB — RPR+RH+ABO+RUB AB+AB SCR+CB...
Antibody Screen: NEGATIVE
HIV Screen 4th Generation wRfx: NONREACTIVE
Hematocrit: 34.5 % (ref 34.0–46.6)
Hemoglobin: 11.6 g/dL (ref 11.1–15.9)
Hepatitis B Surface Ag: NEGATIVE
MCH: 30.4 pg (ref 26.6–33.0)
MCHC: 33.6 g/dL (ref 31.5–35.7)
MCV: 91 fL (ref 79–97)
Platelets: 284 10*3/uL (ref 150–450)
RBC: 3.81 x10E6/uL (ref 3.77–5.28)
RDW: 13.3 % (ref 11.7–15.4)
RPR Ser Ql: NONREACTIVE
Rh Factor: POSITIVE
Rubella Antibodies, IGG: 3.64 index (ref >0.99)
Varicella zoster IgG: 787 index (ref >165)
WBC: 9.4 10*3/uL (ref 3.4–10.8)

## 2019-12-02 LAB — SJOGRENS SYNDROME-A EXTRACTABLE NUCLEAR ANTIBODY: ENA SSA (RO) Ab: <0.2 AI (ref 0.0–0.9)

## 2019-12-02 LAB — SJOGRENS SYNDROME-B EXTRACTABLE NUCLEAR ANTIBODY: ENA SSB (LA) Ab: <0.2 AI (ref 0.0–0.9)

## 2019-12-04 LAB — CYTOLOGY - PAP
Chlamydia: NEGATIVE
Comment: NEGATIVE
Comment: NEGATIVE
Comment: NEGATIVE
Comment: NORMAL
Diagnosis: UNDETERMINED — AB
High risk HPV: POSITIVE — AB
Neisseria Gonorrhea: NEGATIVE
Trichomonas: NEGATIVE

## 2019-12-06 LAB — MATERNIT21 PLUS CORE+SCA
Fetal Fraction: 9
Monosomy X (Turner Syndrome): NOT DETECTED
Result (T21): NEGATIVE
Trisomy 13 (Patau syndrome): NEGATIVE
Trisomy 18 (Edwards syndrome): NEGATIVE
Trisomy 21 (Down syndrome): NEGATIVE
XXX (Triple X Syndrome): NOT DETECTED
XXY (Klinefelter Syndrome): NOT DETECTED
XYY (Jacobs Syndrome): NOT DETECTED

## 2019-12-16 ENCOUNTER — Ambulatory Visit (INDEPENDENT_AMBULATORY_CARE_PROVIDER_SITE_OTHER): Payer: Medicaid Other

## 2019-12-16 ENCOUNTER — Other Ambulatory Visit: Payer: Self-pay | Admitting: Advanced Practice Midwife

## 2019-12-16 ENCOUNTER — Other Ambulatory Visit: Payer: Self-pay

## 2019-12-16 ENCOUNTER — Encounter: Payer: Self-pay | Admitting: Advanced Practice Midwife

## 2019-12-16 ENCOUNTER — Ambulatory Visit (INDEPENDENT_AMBULATORY_CARE_PROVIDER_SITE_OTHER): Payer: Medicaid Other | Admitting: Advanced Practice Midwife

## 2019-12-16 VITALS — BP 100/60 | Wt 160.0 lb

## 2019-12-16 DIAGNOSIS — Z3689 Encounter for other specified antenatal screening: Secondary | ICD-10-CM | POA: Diagnosis not present

## 2019-12-16 DIAGNOSIS — Z3492 Encounter for supervision of normal pregnancy, unspecified, second trimester: Secondary | ICD-10-CM

## 2019-12-16 DIAGNOSIS — Z369 Encounter for antenatal screening, unspecified: Secondary | ICD-10-CM | POA: Diagnosis not present

## 2019-12-16 DIAGNOSIS — Z3A19 19 weeks gestation of pregnancy: Secondary | ICD-10-CM

## 2019-12-16 NOTE — Progress Notes (Signed)
  Routine Prenatal Care Visit  Subjective  Michelle Massey is a 29 y.o. G1P0000 at [redacted]w[redacted]d being seen today for ongoing prenatal care.  She is currently monitored for the following issues for this low-risk pregnancy and has RLQ abdominal pain; Supervision of low-risk pregnancy, second trimester; Systemic lupus complicating pregnancy (HCC); and Discoid lupus erythematosus on their problem list.  ----------------------------------------------------------------------------------- Patient reports no complaints. We discussed results of anatomy scan/low lying placenta and follow up in 3rd trimester.  . Vag. Bleeding: None.  Movement: Absent. Leaking Fluid denies.  ----------------------------------------------------------------------------------- The following portions of the patient's history were reviewed and updated as appropriate: allergies, current medications, past family history, past medical history, past social history, past surgical history and problem list. Problem list updated.  Objective  Blood pressure 100/60, weight 160 lb (72.6 kg), last menstrual period 09/15/2019 Pregravid weight 135 lb (61.2 kg) Total Weight Gain 25 lb (11.3 kg) Urinalysis: Urine Protein    Urine Glucose    Fetal Status: Fetal Heart Rate (bpm): 156 Fundal Height: 19 cm Movement: Absent      Anatomy scan: complete, normal, female, cephalic, placenta posterior and low lying at 7.8 mm from cervical os  General:  Alert, oriented and cooperative. Patient is in no acute distress.  Skin: Skin is warm and dry. No rash noted.   Cardiovascular: Normal heart rate noted  Respiratory: Normal respiratory effort, no problems with respiration noted  Abdomen: Soft, gravid, appropriate for gestational age. Pain/Pressure: Absent     Pelvic:  Cervical exam deferred        Extremities: Normal range of motion.  Edema: None  Mental Status: Normal mood and affect. Normal behavior. Normal judgment and thought content.   Assessment   29  y.o. G1P0000 at [redacted]w[redacted]d by  05/07/2020, by Ultrasound presenting for routine prenatal visit  Plan   Low lying placenta: will need f/u u/s in third trimester  Preterm labor symptoms and general obstetric precautions including but not limited to vaginal bleeding, contractions, leaking of fluid and fetal movement were reviewed in detail with the patient.   Return in about 4 weeks (around 01/13/2020) for rob.  Tresea Mall, CNM 12/16/2019 5:03 PM

## 2020-01-13 ENCOUNTER — Encounter: Payer: Self-pay | Admitting: Obstetrics and Gynecology

## 2020-01-13 ENCOUNTER — Ambulatory Visit (INDEPENDENT_AMBULATORY_CARE_PROVIDER_SITE_OTHER): Payer: Medicaid Other | Admitting: Obstetrics and Gynecology

## 2020-01-13 ENCOUNTER — Other Ambulatory Visit: Payer: Self-pay

## 2020-01-13 VITALS — BP 110/70 | Ht 67.0 in | Wt 171.6 lb

## 2020-01-13 DIAGNOSIS — Z3A23 23 weeks gestation of pregnancy: Secondary | ICD-10-CM

## 2020-01-13 DIAGNOSIS — Z23 Encounter for immunization: Secondary | ICD-10-CM | POA: Diagnosis not present

## 2020-01-13 DIAGNOSIS — K2100 Gastro-esophageal reflux disease with esophagitis, without bleeding: Secondary | ICD-10-CM

## 2020-01-13 DIAGNOSIS — Z3492 Encounter for supervision of normal pregnancy, unspecified, second trimester: Secondary | ICD-10-CM

## 2020-01-13 DIAGNOSIS — O0990 Supervision of high risk pregnancy, unspecified, unspecified trimester: Secondary | ICD-10-CM

## 2020-01-13 DIAGNOSIS — M329 Systemic lupus erythematosus, unspecified: Secondary | ICD-10-CM

## 2020-01-13 DIAGNOSIS — O99891 Other specified diseases and conditions complicating pregnancy: Secondary | ICD-10-CM

## 2020-01-13 MED ORDER — SUCRALFATE 1 G PO TABS: 1.0000 g | ORAL_TABLET | Freq: Four times a day (QID) | ORAL | 1 refills | Status: DC

## 2020-01-13 NOTE — Progress Notes (Signed)
Routine Prenatal Care Visit  Subjective  Michelle Massey is a 29 y.o. G1P0000 at [redacted]w[redacted]d being seen today for ongoing prenatal care.  She is currently monitored for the following issues for this high-risk pregnancy and has RLQ abdominal pain; Supervision of low-risk pregnancy, second trimester; Systemic lupus complicating pregnancy (HCC); and Discoid lupus erythematosus on their problem list.  ----------------------------------------------------------------------------------- Patient reports heartburn.   Contractions: Not present. Vag. Bleeding: None.  Movement: Present. Denies leaking of fluid.  ----------------------------------------------------------------------------------- The following portions of the patient's history were reviewed and updated as appropriate: allergies, current medications, past family history, past medical history, past social history, past surgical history and problem list. Problem list updated.   Objective  Blood pressure 110/70, height 5\' 7"  (1.702 m), weight 171 lb 9.6 oz (77.8 kg), last menstrual period 09/15/2019, unknown if currently breastfeeding. Pregravid weight 135 lb (61.2 kg) Total Weight Gain 36 lb 9.6 oz (16.6 kg) Urinalysis:      Fetal Status:     Movement: Present     General:  Alert, oriented and cooperative. Patient is in no acute distress.  Skin: Skin is warm and dry. No rash noted.   Cardiovascular: Normal heart rate noted  Respiratory: Normal respiratory effort, no problems with respiration noted  Abdomen: Soft, gravid, appropriate for gestational age. Pain/Pressure: Absent     Pelvic:  Cervical exam deferred        Extremities: Normal range of motion.     Mental Status: Normal mood and affect. Normal behavior. Normal judgment and thought content.     Assessment   29 y.o. G1P0000 at [redacted]w[redacted]d by  05/07/2020, by Ultrasound presenting for routine prenatal visit  Plan   pregnancy 1  Problems (from 09/14/19 to present)    Problem Noted  Resolved   Supervision of low-risk pregnancy, second trimester 12/01/2019 by 01/31/2020, MD No   Overview Addendum 01/13/2020 11:57 AM by 01/15/2020, MD    Clinic [facility] Prenatal [facility]  Dating [name] 9 wks at [facility] Blood type: O/Positive/-- (08/09 0958)   Genetic Screen   NIPS: normal XY  Antibody:Negative (08/09 06-12-1976)  Anatomic 2409  complete, low lying placenta Rubella: 3.64 (08/09 0958)  Varicella: immune   GTT      Third trimester:  RPR: Non Reactive (08/09 0958)   Rhogam Not needed HBsAg: Negative (08/09 0958)   TDaP vaccine                        Flu Shot: 01/13/2020 HIV: Non Reactive (08/09 0958)   Baby Food                                GBS:   Contraception  Pap:12/01/19  CBB  No Lupus  CS/VBAC N/A   Support Person FOB Not involved.  Mother is support person.      High risk diagnoses: Lupus in pregnancy        Previous Version   Systemic lupus complicating pregnancy (HCC) 12/01/2019 by 01/31/2020, MD No   Overview Signed 01/13/2020 11:57 AM by 01/15/2020, MD    Initiate weekly NST's at 32 weeks  Start daily 81 mg ASA           Heart burn- will start Carafate COVID vaccine discussed and encouraged Will refer to MFM for - discussed daily 81 mg ASA iniation and NSTs at 32 weeks  weekly  Low lying placenta- discussed pelvic rest, repeat US at 32 weeks Flu shot today  Gestational age appropriate obstetric precautions including but not limited to vaginal bleeding, contractions, leaking of fluid and fetal movement were reviewed in detail with the patient.    Return in about 4 weeks (around 02/10/2020) for 1 GTT, Growth [place] and ROB with MD.  [name] MD [facility], [facility] 01/13/2020, 12:11 PM

## 2020-01-13 NOTE — Patient Instructions (Addendum)
Second Trimester of Pregnancy The second trimester is from week 14 through week 27 (months 4 through 6). The second trimester is often a time when you feel your best. Your body has adjusted to being pregnant, and you begin to feel better physically. Usually, morning sickness has lessened or quit completely, you may have more energy, and you may have an increase in appetite. The second trimester is also a time when the fetus is growing rapidly. At the end of the sixth month, the fetus is about 9 inches long and weighs about 1 pounds. You will likely begin to feel the baby move (quickening) between 16 and 20 weeks of pregnancy. Body changes during your second trimester Your body continues to go through many changes during your second trimester. The changes vary from woman to woman.  Your weight will continue to increase. You will notice your lower abdomen bulging out.  You may begin to get stretch marks on your hips, abdomen, and breasts.  You may develop headaches that can be relieved by medicines. The medicines should be approved by your health care provider.  You may urinate more often because the fetus is pressing on your bladder.  You may develop or continue to have heartburn as a result of your pregnancy.  You may develop constipation because certain hormones are causing the muscles that push waste through your intestines to slow down.  You may develop hemorrhoids or swollen, bulging veins (varicose veins).  You may have back pain. This is caused by: ? Weight gain. ? Pregnancy hormones that are relaxing the joints in your pelvis. ? A shift in weight and the muscles that support your balance.  Your breasts will continue to grow and they will continue to become tender.  Your gums may bleed and may be sensitive to brushing and flossing.  Dark spots or blotches (chloasma, mask of pregnancy) may develop on your face. This will likely fade after the baby is born.  A dark line from your  belly button to the pubic area (linea nigra) may appear. This will likely fade after the baby is born.  You may have changes in your hair. These can include thickening of your hair, rapid growth, and changes in texture. Some women also have hair loss during or after pregnancy, or hair that feels dry or thin. Your hair will most likely return to normal after your baby is born. What to expect at prenatal visits During a routine prenatal visit:  You will be weighed to make sure you and the fetus are growing normally.  Your blood pressure will be taken.  Your abdomen will be measured to track your baby's growth.  The fetal heartbeat will be listened to.  Any test results from the previous visit will be discussed. Your health care provider may ask you:  How you are feeling.  If you are feeling the baby move.  If you have had any abnormal symptoms, such as leaking fluid, bleeding, severe headaches, or abdominal cramping.  If you are using any tobacco products, including cigarettes, chewing tobacco, and electronic cigarettes.  If you have any questions. Other tests that may be performed during your second trimester include:  Blood tests that check for: ? Low iron levels (anemia). ? High blood sugar that affects pregnant women (gestational diabetes) between 24 and 28 weeks. ? Rh antibodies. This is to check for a protein on red blood cells (Rh factor).  Urine tests to check for infections, diabetes, or protein in the   urine.  An ultrasound to confirm the proper growth and development of the baby.  An amniocentesis to check for possible genetic problems.  Fetal screens for spina bifida and Down syndrome.  HIV (human immunodeficiency virus) testing. Routine prenatal testing includes screening for HIV, unless you choose not to have this test. Follow these instructions at home: Medicines  Follow your health care provider's instructions regarding medicine use. Specific medicines may be  either safe or unsafe to take during pregnancy.  Take a prenatal vitamin that contains at least 600 micrograms (mcg) of folic acid.  If you develop constipation, try taking a stool softener if your health care provider approves. Eating and drinking   Eat a balanced diet that includes fresh fruits and vegetables, whole grains, good sources of protein such as meat, eggs, or tofu, and low-fat dairy. Your health care provider will help you determine the amount of weight gain that is right for you.  Avoid raw meat and uncooked cheese. These carry germs that can cause birth defects in the baby.  If you have low calcium intake from food, talk to your health care provider about whether you should take a daily calcium supplement.  Limit foods that are high in fat and processed sugars, such as fried and sweet foods.  To prevent constipation: ? Drink enough fluid to keep your urine clear or pale yellow. ? Eat foods that are high in fiber, such as fresh fruits and vegetables, whole grains, and beans. Activity  Exercise only as directed by your health care provider. Most women can continue their usual exercise routine during pregnancy. Try to exercise for 30 minutes at least 5 days a week. Stop exercising if you experience uterine contractions.  Avoid heavy lifting, wear low heel shoes, and practice good posture.  A sexual relationship may be continued unless your health care provider directs you otherwise. Relieving pain and discomfort  Wear a good support bra to prevent discomfort from breast tenderness.  Take warm sitz baths to soothe any pain or discomfort caused by hemorrhoids. Use hemorrhoid cream if your health care provider approves.  Rest with your legs elevated if you have leg cramps or low back pain.  If you develop varicose veins, wear support hose. Elevate your feet for 15 minutes, 3-4 times a day. Limit salt in your diet. Prenatal Care  Write down your questions. Take them to  your prenatal visits.  Keep all your prenatal visits as told by your health care provider. This is important. Safety  Wear your seat belt at all times when driving.  Make a list of emergency phone numbers, including numbers for family, friends, the hospital, and police and fire departments. General instructions  Ask your health care provider for a referral to a local prenatal education class. Begin classes no later than the beginning of month 6 of your pregnancy.  Ask for help if you have counseling or nutritional needs during pregnancy. Your health care provider can offer advice or refer you to specialists for help with various needs.  Do not use hot tubs, steam rooms, or saunas.  Do not douche or use tampons or scented sanitary pads.  Do not cross your legs for long periods of time.  Avoid cat litter boxes and soil used by cats. These carry germs that can cause birth defects in the baby and possibly loss of the fetus by miscarriage or stillbirth.  Avoid all smoking, herbs, alcohol, and unprescribed drugs. Chemicals in these products can affect the formation   and growth of the baby.  Do not use any products that contain nicotine or tobacco, such as cigarettes and e-cigarettes. If you need help quitting, ask your health care provider.  Visit your dentist if you have not gone yet during your pregnancy. Use a soft toothbrush to brush your teeth and be gentle when you floss. Contact a health care provider if:  You have dizziness.  You have mild pelvic cramps, pelvic pressure, or nagging pain in the abdominal area.  You have persistent nausea, vomiting, or diarrhea.  You have a bad smelling vaginal discharge.  You have pain when you urinate. Get help right away if:  You have a fever.  You are leaking fluid from your vagina.  You have spotting or bleeding from your vagina.  You have severe abdominal cramping or pain.  You have rapid weight gain or weight loss.  You have  shortness of breath with chest pain.  You notice sudden or extreme swelling of your face, hands, ankles, feet, or legs.  You have not felt your baby move in over an hour.  You have severe headaches that do not go away when you take medicine.  You have vision changes. Summary  The second trimester is from week 14 through week 27 (months 4 through 6). It is also a time when the fetus is growing rapidly.  Your body goes through many changes during pregnancy. The changes vary from woman to woman.  Avoid all smoking, herbs, alcohol, and unprescribed drugs. These chemicals affect the formation and growth your baby.  Do not use any tobacco products, such as cigarettes, chewing tobacco, and e-cigarettes. If you need help quitting, ask your health care provider.  Contact your health care provider if you have any questions. Keep all prenatal visits as told by your health care provider. This is important. This information is not intended to replace advice given to you by your health care provider. Make sure you discuss any questions you have with your health care provider. Document Revised: 08/02/2018 Document Reviewed: 05/16/2016 Elsevier Patient Education  2020 [employer] Inc.    Preventing Smoking Relapse After Pregnancy Protecting your unborn baby from the dangers of smoking can be reason enough to stop smoking while you are pregnant. However, it may be hard not to start smoking again (relapse) once your baby is born, especially with the stress of being a new parent. Certain people, places, and situations (called triggers) can also make you want to smoke. Knowing your triggers and making some diet and lifestyle choices can help prevent a relapse. Talk with your health care provider if you are struggling to quit smoking or to remain smoke free. How can smoking affect me? If you smoke, you are at greater risk for serious health problems, including:  Heart disease.  Lung disease.  Many types  of cancer. The benefits of not smoking include:  Having more energy.  Having an improved sense of taste and smell.  Having no smoke odor or residue (thirdhand smoke) on your clothes or in your home and car.  Saving money from not buying cigarettes. How can this behavior affect my baby? Smoking around a newborn baby (secondhand smoke) is harmful. If you smoke around your baby, your baby has a greater risk for:  Sudden infant death syndrome (SIDS).  Colic.  Difficulty breastfeeding.  Asthma and other lung problems.  Being overweight or obese as a child or adult.  Ear infections.  Behavior and learning problems.  Becoming a smoker.  What actions can I take to prevent a smoking relapse? Nutrition Right after you quit smoking, you may crave certain foods. You may really want to eat sweets. You may also feel the urge to have something in your mouth when you want a cigarette. You can make healthy food choices so you do not overeat and gain weight. Some ways you can do this are:  Snack on crunchy fruits and vegetables.  Eat whole-grain foods, such as cereals and breads, to help you feel full.  Eat foods that are very flavorful.  Do not eat sugary foods.  If you crave sweets, frozen treats may help. Choose: ? Naturally sweet foods, such as frozen fruits or fruit bars. ? Sugar-free or fat-free ice cream or sherbet. ? Frozen grapes.  Keep sugar-free hard candies and gum on hand for times when you want to have something in your mouth. Getting too hungry can lead to low blood sugar. This can trigger a craving to smoke. Make sure you:  Eat breakfast every day.  Eat regularly throughout the day.  Have nutritious, low-calorie snacks between meals, such as fruits, vegetables, nuts, and low-fat yogurt.  Do not skip lunch or dinner.  Lifestyle The following lifestyle changes can also help you to prevent a smoking relapse after pregnancy:  Get regular exercise. It may be easier  to exercise for short periods of time throughout the day. Try taking your baby for a daily walk.  Avoid places where smoking is allowed.  Avoid the places where you used to smoke.  Avoid people you used to smoke with.  Take up a new hobby.  Find fun new things to enjoy with your baby.  Drink enough water to keep your urine pale yellow.  Get enough sleep. Rest when the baby is asleep, and ask for help taking care of the baby.  Practice relaxation techniques every day to reduce stress. These include deep-breathing exercises, yoga, and meditation.  Avoid alcohol and caffeine, which can trigger cravings to smoke.  Where to find support For more support, go to:  Your health care provider. Ask about support groups in your area.  Online support groups.  EX: www.becomeanex.org  BankRights.uy: smokefree.gov Where to find more information Learn more about preventing smoking relapse after pregnancy from:  BankRights.uy: women.smokefree.gov  Centers for Disease Control and Prevention: FootballExhibition.com.br  American Cancer Society: www.cancer.org Contact a health care provider if:  You are struggling to quit smoking or to remain smoke free. Summary  The stress of being a new parent can sometimes make it hard not to relapse.  Knowing your triggers and making some diet and lifestyle choices can help prevent a relapse.  Have nutritious, low-calorie snacks between meals, such as fruits, vegetables, nuts, and low-fat yogurt.  Contact your health care provider if you need help to quit smoking or to remain smoke free. This information is not intended to replace advice given to you by your health care provider. Make sure you discuss any questions you have with your health care provider. Document Revised: 01/01/2019 Document Reviewed: 05/28/2017 Elsevier Patient Education  2020 [employer].  Pregnancy and Smoking Smoking during pregnancy is unhealthy for you and your baby. Smoke from  cigarettes, e-cigarettes, pipes, and cigars contains many chemicals that can cause cancer (carcinogens). These products also contain a stimulant drug (nicotine). When you smoke, harmful substances that you breathe in enter your bloodstream and can be passed on to your baby. This can affect your baby's development. If you are planning  to become pregnant or have recently become pregnant, talk with your health care provider about quitting smoking. You have a much better chance of having a healthy pregnancy and a healthy baby if you do not smoke while you are pregnant. How does smoking affect me? Smoking increases your risk for many long-term (chronic) diseases. These diseases include cancer, lung diseases, and heart disease. Smoking during pregnancy increases your risk of:  Losing the pregnancy (miscarriage or stillbirth).  Giving birth too early (premature birth).  Pregnancy outside of the uterus (tubal pregnancy).  Problems with the placenta, which is the organ that provides the baby nourishment and oxygen. These problems may include: ? Attachment of the placenta over the opening of the uterus (placenta previa). ? Detachment of the placenta before the baby's birth (placental abruption).  Having your water break before labor begins. How does smoking affect my baby? Before birth Smoking during pregnancy:  Decreases blood flow and oxygen to your baby.  Increases your baby's risk of birth defects, such as heart defects.  Increases your baby's heart rate.  Slows your baby's growth in the uterus (intrauterine growth retardation). After birth Babies born to women who smoked during pregnancy may:  Have symptoms of nicotine withdrawal.  Be born with a cleft lip, cleft palate, or other facial deformities.  Be too small at birth.  Have a high risk of: ? Serious health problems or lifelong disabilities. These may result in the long-term need for certain medicines, therapies, or other  treatments. ? Sudden infant death syndrome (SIDS). Follow these instructions at home:   Do not use any products that contain nicotine or tobacco, such as cigarettes, e-cigarettes, and chewing tobacco. If you need help quitting, ask your health care provider.  Talk with your health care provider about support strategies to quit smoking. Some methods to consider include: ? Counseling (smoking cessation counseling). ? Psychotherapy. ? Acupuncture. ? Hypnosis. ? Telephone hotlines for people trying to quit.  Do not take nicotine supplements or medicine to help you quit smoking unless your health care provider tells you to do so.  Avoid secondhand smoke. Ask people who smoke to avoid smoking around you.  Identify people, places, things, and activities that make you want to smoke (triggers). Avoid them. Where to find more information Learn more about smoking during pregnancy and quitting smoking from:  March of Dimes: www.marchofdimes.org  U.S. Department of Health and Human Services: women.smokefree.gov  American Cancer Society: www.cancer.org  American Heart Association: www.heart.org  National Cancer Institute: www.cancer.gov For help to quit smoking:  National smoking cessation telephone hotline: 1-800-QUIT NOW (281)202-2508) Contact a health care provider if:  You are struggling to quit smoking.  You are a smoker and you become pregnant or plan to become pregnant.  You start smoking again after giving birth. Summary  Smoking during pregnancy is unhealthy for you and your baby.  Tobacco smoke contains harmful substances that can affect a baby's health and development.  Smoking increases the risk for serious problems, such as miscarriage, birth defects, or premature birth.  If you need help to quit smoking, talk to your health care provider and ask about support strategies such as counseling. This information is not intended to replace advice given to you by your health  care provider. Make sure you discuss any questions you have with your health care provider. Document Revised: 11/08/2018 Document Reviewed: 11/08/2018 Elsevier Patient Education  2020 [employer].

## 2020-01-19 ENCOUNTER — Other Ambulatory Visit: Payer: Self-pay

## 2020-01-19 ENCOUNTER — Ambulatory Visit: Payer: Medicaid Other | Attending: Obstetrics and Gynecology | Admitting: Maternal & Fetal Medicine

## 2020-01-19 DIAGNOSIS — M329 Systemic lupus erythematosus, unspecified: Secondary | ICD-10-CM

## 2020-01-19 DIAGNOSIS — O99891 Other specified diseases and conditions complicating pregnancy: Secondary | ICD-10-CM

## 2020-01-19 DIAGNOSIS — Z3A24 24 weeks gestation of pregnancy: Secondary | ICD-10-CM | POA: Diagnosis not present

## 2020-01-19 DIAGNOSIS — O0990 Supervision of high risk pregnancy, unspecified, unspecified trimester: Secondary | ICD-10-CM

## 2020-01-19 NOTE — Progress Notes (Signed)
Maternal-Fetal Medicine   Name: Michelle Massey DOB: [birth year] MRN: [id number] Referring Provider: Adelene Idler, MD  I had the pleasure of seeing Michelle Massey P0 at 24w 3d gestation, today at the [facility]. She was accompanied by her mother. She is here for consultation because of her diagnosis of systemic lupus erythematosus (SLE). Patient reports that SLE was diagnosed at age 29 years when she had a rash on her face. Following the diagnosis, she had intermittent "flares" that consist of symptoms of cracked lips, back pain, malaise and fatigue. She reports having had mild fatigue and cracked lips in May 2021 (first trimester). Since then, she has not had any flares. She has not taken hydroxychloroquine or steroids for treatment. Patient denies history of hypertension or renal disease. Most-recent creatinine level (10/06/19) was 0.54. No increased proteinuria was seen on urine examination. Anti-SSA and anti-SSB antibodies are not increased. She does not have diabetes or thyroid disorder. PSH: Nil of note. Medications: Prenatal vitamins. Low-dose aspirin was recommended but the patient is not taking aspirin. Allergies: NKDA. Social: Denies tobacco or drug or alcohol use. She is single and her partner is a Caucasian. Family: Mother had Hodgkin's lymphoma (now in remission) and has hypothyroidism. Father died at age 19 (car accident). MGM died of blood clot complication. Gyn: History of abnormal Pap smears (ASCUS); no cervical surgeries. No history of breast disease. She had irregular cycles before pregnancy.   Prenatal course: Her pregnancy is well-dated by 9-week ultrasound. On cell-free fetal DNA screening, the risks of fetal aneuploidies are not increased. Fetal anatomy scan performed at your office was normal. Patient had flu vaccine in this pregnancy. She does not want to take COVID vaccine.  Our concerns include: Systemic Lupus Erythematosus (SLE) in pregnancy Although  some studies have reported increase in flares during pregnancy (13% to 65%), it is variable. Pregnancy does not seem to increase flares in pregnancy. Women who have not had flares for at least 3 to 6 months before conception do not have adverse outcomes. Ms. Scearce had minimal symptoms in May 2021 that was consistent with her impression of a "flare." However, she has not had recurrence of symptoms and that is reassuring.  I counseled the patient that flares should be aggressively treated to prevent adverse outcomes. It is reassuring that she did not have flares before her pregnancy. I discussed the safety profile of prednisone that is largely metabolized in the placenta and only a small amount reaches the fetus. High doses of prednisone for a short time with taper can be used in flares.  Immunosuppressants (azathioprine) can be safely used if needed. Fortunately, she does not have lupus nephritis.  I also counseled the patient on the possible pregnancy adverse outcomes in SLE. They include preterm delivery, growth restriction, stillbirth, placental abruption and development of mild or severe preeclampsia.   I discussed and recommended low-dose aspirin that delays or prevents preeclampsia. I reassured her of negative anti-SSA and anti-SSB antibodies and that the likelihood of neonatal lupus or congenital heart block is very low.  We discussed ultrasound protocol and serial fetal growth assessments every 4 weeks. No early delivery is indicated if SLE is stable without flares. Vaginal delivery should be the goal and cesarean delivery should be performed only for obstetric indications.  Recommendations -Fetal growth assessments every 4 weeks. -Screening for GDM at 26 to 28 weeks. -Low-dose aspirin (81 mg daily) till delivery.  Thank you for consultation. Please contact me at the Maternal Fetal Care, [facility]  if you have any questions. Consultation including face-to-face counseling: 45 minutes.

## 2020-02-02 ENCOUNTER — Emergency Department
Admission: EM | Admit: 2020-02-02 | Discharge: 2020-02-02 | Disposition: A | Discharge status: Home / Self Care | Payer: Medicaid Other | Attending: Emergency Medicine | Admitting: Emergency Medicine

## 2020-02-02 ENCOUNTER — Other Ambulatory Visit: Payer: Self-pay

## 2020-02-02 DIAGNOSIS — Z87891 Personal history of nicotine dependence: Secondary | ICD-10-CM | POA: Diagnosis not present

## 2020-02-02 DIAGNOSIS — K029 Dental caries, unspecified: Secondary | ICD-10-CM | POA: Diagnosis not present

## 2020-02-02 DIAGNOSIS — R221 Localized swelling, mass and lump, neck: Secondary | ICD-10-CM | POA: Diagnosis present

## 2020-02-02 DIAGNOSIS — M542 Cervicalgia: Secondary | ICD-10-CM | POA: Diagnosis not present

## 2020-02-02 MED ORDER — AMOXICILLIN 875 MG PO TABS: 875.0000 mg | ORAL_TABLET | Freq: Two times a day (BID) | ORAL | 0 refills | Status: DC

## 2020-02-02 NOTE — Discharge Instructions (Signed)
Begin taking antibiotics as directed twice a day for the next 10 days.  You may take Tylenol or ibuprofen if needed for inflammation or discomfort.  Call and make an appointment with one of the dental clinics listed on your discharge papers or the clinic in [place] city and [facility] also have walk-in hours.  OPTIONS FOR DENTAL FOLLOW UP CARE  [place] Department of Health and Human Services - Local Safety Net Dental Clinics [email or web address]   [facility] [phone])  Sharl Ma [phone])  [place] [phone])  [facility] [phone])  [facility] [phone]) This clinic caters to the indigent population and is on a lottery system. Location: [employer] [facility], [employer], [address], [place] Clinic Hours: Wednesdays from 6pm - 9pm, patients seen by a lottery system. For dates, call or go to [email or web address] Services: Cleanings, fillings and simple extractions. Payment Options: DENTAL WORK IS FREE OF CHARGE. Bring proof of income or support. Best way to get seen: Arrive at 5:15 pm - this is a lottery, NOT first come/first serve, so arriving earlier will not increase your chances of being seen.     [facility] [phone] Select option 1 for emergencies   Location: [facility], [place], [address], [place] Clinic Hours: No walk-ins accepted - call the day before to schedule an appointment. Check in times are 9:30 am and 1:30 pm. Services: Simple extractions, temporary fillings, pulpectomy/pulp debridement, uncomplicated abscess drainage. Payment Options: PAYMENT IS DUE AT THE TIME OF SERVICE.  Fee is usually $100-200, additional surgical procedures (e.g. abscess drainage) may be extra. Cash, checks, Visa/MasterCard accepted.  Can file Medicaid if  patient is covered for dental - patient should call case worker to check. No discount for [facility] patients. Best way to get seen: MUST call the day before and get onto the schedule. Can usually be seen the next 1-2 days. No walk-ins accepted.     [facility] [phone]   Location: [facility], [address], [place] Clinic Hours: M, W, Th, F 8am or 1:30pm, Tues 9a or 1:30 - first come/first served. Services: Simple extractions, temporary fillings, uncomplicated abscess drainage.  You do not need to be an [facility] resident. Payment Options: PAYMENT IS DUE AT THE TIME OF SERVICE. Dental insurance, otherwise sliding scale - bring proof of income or support. Depending on income and treatment needed, cost is usually $50-200. Best way to get seen: Arrive early as it is first come/first served.     [facility] [phone]   Location: [address] Clinic Hours: Mon-Thu 8a-5p Services: Most basic dental services including extractions and fillings. Payment Options: PAYMENT IS DUE AT THE TIME OF SERVICE. Sliding scale, up to 50% off - bring proof if income or support. Medicaid with dental option accepted. Best way to get seen: Call to schedule an appointment, can usually be seen within 2 weeks OR they will try to see walk-ins - show up at 8a or 2p (you may have to wait).     [facility] [phone] [place] RESIDENTS ONLY   Location: [facility], [address] [place], [place] [address] Clinic Hours: By appointment only. Monday - Thursday 8am-5pm, Friday 8am-12pm Services: Cleanings, fillings, extractions. Payment Options: PAYMENT IS DUE AT THE TIME OF SERVICE. Cash, Visa or MasterCard. Sliding scale - $30 minimum per service.  Best way to get seen: Come in to office, complete packet and make an appointment - need proof of income or  support monies for each household member and proof of [facility] residence. Usually takes about a month to get in.     [facility] [phone]   Location: [address], [facility] Clinic Hours: Walk-in Urgent Care Dental Services are offered Monday-Friday mornings only. The numbers of emergencies accepted daily is limited to the number of providers available. Maximum 15 - Mondays, Wednesdays & Thursdays Maximum 10 - Tuesdays & Fridays Services: You do not need to be a [facility] resident to be seen for a dental emergency. Emergencies are defined as pain, swelling, abnormal bleeding, or dental trauma. Walkins will receive x-rays if needed. NOTE: Dental cleaning is not an emergency. Payment Options: PAYMENT IS DUE AT THE TIME OF SERVICE. Minimum co-pay is $40.00 for uninsured patients. Minimum co-pay is $3.00 for Medicaid with dental coverage. Dental Insurance is accepted and must be presented at time of visit. Medicare does not cover dental. Forms of payment: Cash, credit card, checks. Best way to get seen: If not previously registered with the clinic, walk-in dental registration begins at 7:15 am and is on a first come/first serve basis. If previously registered with the clinic, call to make an appointment.     [facility] [phone] [place] RESIDENTS ONLY   Location: [address] [place], [place] Clinic Hours: Mon-Thu 10a-2p Services: Extractions only! Payment Options: FREE (donations accepted) - bring proof of income or support Best way to get seen: Call and schedule an appointment OR come at 8am on the 1st Monday of every month (except for holidays) when it is first come/first served.     [facility] [phone]   Location: [address] [place], [place] Clinic Hours: Friday mornings Services, Payment Options, Best way to get seen: Call for info

## 2020-02-02 NOTE — ED Provider Notes (Signed)
[facility] Emergency Department Provider Note   ____________________________________________   First MD Initiated Contact with Patient 02/02/20 [phone]     (approximate)  I have reviewed the triage vital signs and the nursing notes.   HISTORY  Chief Complaint Neck Pain   HPI Michelle Massey is a 29 y.o. female presents to the ED after noticing that there is some swelling under her chin on the left side this morning.  Patient denies any sore throat, fever, nausea, vomiting, fever or chills.         Past Medical History:  Diagnosis Date  . Collagen vascular disease (HCC)   . Lupus (HCC)   . Scoliosis     Patient Active Problem List   Diagnosis Date Noted  . Supervision of high risk pregnancy, antepartum 12/01/2019  . 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 RLQ abdominal pain 10/07/2019  . 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 No pertinent surgical history.  Prior to Admission medications   Medication Sig Start Date End Date Taking? Authorizing Provider  amoxicillin (AMOXIL) 875 MG tablet Take 1 tablet (875 mg total) by mouth 2 (two) times daily. 02/02/20   Tommi Rumps, PA-C  Prenatal Vit-Fe Fumarate-FA (PRENATAL MULTIVITAMIN) TABS tablet Take 1 tablet by mouth daily at 12 noon.    [provider]  sucralfate (CARAFATE) 1 g tablet Take 1 tablet (1 g total) by mouth 4 (four) times daily. 01/13/20 01/12/21  [name], MD    Allergies Patient has no known allergies.  Family History  Problem Relation Age of Onset  . Hodgkin's lymphoma Mother   . Hypertension Mother   . Cancer Mother   . Asthma Mother   . Bipolar disorder Sister   . Schizophrenia Sister   . Diabetes Maternal Great-grandfather   . Hypertension Maternal Great-grandfather   . Breast cancer Neg Hx   . Ovarian cancer Neg Hx   . Colon cancer Neg Hx     Social History Social History   Tobacco Use  . Smoking  status: Former Smoker    Packs/day: 0.50    Types: Cigarettes    Start date: 2013    Quit date: 01/22/2018    Years since quitting: 2.0  . Smokeless tobacco: Never Used  Vaping Use  . Vaping Use: Never used  Substance Use Topics  . Alcohol use: Not Currently  . Drug use: Yes    Types: Marijuana    Comment: Quit at pregnancy-June 2021    Review of Systems Constitutional: No fever/chills Eyes: No visual changes. ENT: No sore throat.  Positive swelling near tongue.  Positive neck pain. Cardiovascular: Denies chest pain. Respiratory: Denies shortness of breath. Musculoskeletal: Negative for back pain. Skin: Negative for rash. Neurological: Negative for headaches, focal weakness or numbness. ____________________________________________   PHYSICAL EXAM:  VITAL SIGNS: ED Triage Vitals  Enc Vitals Group     BP 02/02/20 0744 124/69     Pulse Rate 02/02/20 0744 93     Resp 02/02/20 0744 16     Temp 02/02/20 0744 98.5 F (36.9 C)     Temp Source 02/02/20 0744 Oral     SpO2 02/02/20 0744 98 %     Weight 02/02/20 0745 175 lb (79.4 kg)     Height 02/02/20 0745 5\' 6"  (1.676 m)     Head Circumference --      Peak Flow --      Pain Score 02/02/20 0747 0  Pain Loc --      Pain Edu? --      Excl. in GC? --     Constitutional: Alert and oriented. Well appearing and in no acute distress. Eyes: Conjunctivae are normal.  Head: Atraumatic. Nose: No congestion/rhinnorhea. Mouth/Throat: Mucous membranes are moist.  Oropharynx non-erythematous.  No exudate and uvula is midline.  There is some posterior drainage that is minimal.  On exam there is a left sided tender lymph node just under the mandible posteriorly.  On examination of the teeth there is a cary just below the inflamed lymph node.  Moderate tenderness on palpation of the gum with a tongue depressor.  No active drainage is seen.  No masses noted under the tongue. Neck: No stridor.   Cardiovascular: Normal rate, regular  rhythm. Grossly normal heart sounds.  Good peripheral circulation. Respiratory: Normal respiratory effort.  No retractions. Lungs CTAB. Musculoskeletal: Moves upper and lower extremities without any difficulty.  Gait was noted. Neurologic:  Normal speech and language. No gross focal neurologic deficits are appreciated. No gait instability. Skin:  Skin is warm, dry and intact. No rash noted. Psychiatric: Mood and affect are normal. Speech and behavior are normal.  ____________________________________________   LABS (all labs ordered are listed, but only abnormal results are displayed)  Labs Reviewed - No data to display   PROCEDURES  Procedure(s) performed (including Critical Care):  Procedures   ____________________________________________   INITIAL IMPRESSION / ASSESSMENT AND PLAN / ED COURSE  As part of my medical decision making, I reviewed the following data within the electronic MEDICAL RECORD NUMBER Notes from prior ED visits and [place] Controlled Substance Database  29 year old female presents to the ED with complaint of concerns of swelling to the left side under her tongue.  On exam the left side shows an inflamed lymph node that is mobile but moderately tender.  There is a dental carry that corresponds with the lymph node.  Area is moderately tender to palpation but no drainage is noted.  Patient was placed on amoxicillin 875 twice daily for 10 days and given a list of dental clinics for her to follow-up with.  ____________________________________________   FINAL CLINICAL IMPRESSION(S) / ED DIAGNOSES  Final diagnoses:  Dental caries     ED Discharge Orders         Ordered    amoxicillin (AMOXIL) 875 MG tablet  2 times daily        02/02/20 0841          *Please note:  [name] was evaluated in Emergency Department on 02/02/2020 for the symptoms described in the history of present illness. She was evaluated in the context of the global COVID-19 pandemic, which  necessitated consideration that the patient might be at risk for infection with the SARS-CoV-2 virus that causes COVID-19. Institutional protocols and algorithms that pertain to the evaluation of patients at risk for COVID-19 are in a state of rapid change based on information released by regulatory bodies including the CDC and federal and state organizations. These policies and algorithms were followed during the patient's care in the ED.  Some ED evaluations and interventions may be delayed as a result of limited staffing during and the pandemic.*   Note:  This document was prepared using Dragon voice recognition software and may include unintentional dictation errors.    Tommi Rumps, PA-C 02/02/20 1536    Gilles Chiquito, MD 02/02/20 [phone]

## 2020-02-02 NOTE — ED Notes (Signed)
See triage note- pt here with c/o L sided swelling under her tongue. Denies any pain or difficulty swallowing.

## 2020-02-02 NOTE — ED Triage Notes (Signed)
Pt c/o swelling to the left side of neck under her chin that she woke up with this morning, denies sore throat. States "it feels like a know under my tongue"

## 2020-02-09 ENCOUNTER — Other Ambulatory Visit: Payer: Self-pay | Admitting: Obstetrics & Gynecology

## 2020-02-09 DIAGNOSIS — Z3492 Encounter for supervision of normal pregnancy, unspecified, second trimester: Secondary | ICD-10-CM

## 2020-02-10 ENCOUNTER — Other Ambulatory Visit: Payer: Self-pay

## 2020-02-10 ENCOUNTER — Other Ambulatory Visit: Payer: Medicaid Other

## 2020-02-10 ENCOUNTER — Ambulatory Visit (INDEPENDENT_AMBULATORY_CARE_PROVIDER_SITE_OTHER): Payer: Medicaid Other

## 2020-02-10 ENCOUNTER — Ambulatory Visit (INDEPENDENT_AMBULATORY_CARE_PROVIDER_SITE_OTHER): Payer: Medicaid Other | Admitting: Obstetrics and Gynecology

## 2020-02-10 VITALS — BP 114/72 | Ht 66.0 in | Wt 174.0 lb

## 2020-02-10 DIAGNOSIS — Z3492 Encounter for supervision of normal pregnancy, unspecified, second trimester: Secondary | ICD-10-CM

## 2020-02-10 DIAGNOSIS — O99891 Other specified diseases and conditions complicating pregnancy: Secondary | ICD-10-CM

## 2020-02-10 DIAGNOSIS — Z3A23 23 weeks gestation of pregnancy: Secondary | ICD-10-CM | POA: Diagnosis not present

## 2020-02-10 DIAGNOSIS — M329 Systemic lupus erythematosus, unspecified: Secondary | ICD-10-CM | POA: Diagnosis not present

## 2020-02-10 DIAGNOSIS — Z3A27 27 weeks gestation of pregnancy: Secondary | ICD-10-CM

## 2020-02-10 DIAGNOSIS — O0990 Supervision of high risk pregnancy, unspecified, unspecified trimester: Secondary | ICD-10-CM

## 2020-02-10 LAB — POCT URINALYSIS DIPSTICK OB
Glucose, UA: NEGATIVE
POC,PROTEIN,UA: NEGATIVE

## 2020-02-10 NOTE — Progress Notes (Addendum)
Routine Prenatal Care Visit  Subjective  Michelle Massey is a 29 y.o. G1P0000 at [redacted]w[redacted]d being seen today for ongoing prenatal care.  She is currently monitored for the following issues for this low-risk pregnancy and has RLQ abdominal pain; Supervision of high risk pregnancy, antepartum; Systemic lupus complicating pregnancy (HCC); and Discoid lupus erythematosus on their problem list.  ----------------------------------------------------------------------------------- Patient reports no complaints.    . Vag. Bleeding: None.  Movement: Present. Denies leaking of fluid.  ----------------------------------------------------------------------------------- The following portions of the patient's history were reviewed and updated as appropriate: allergies, current medications, past family history, past medical history, past social history, past surgical history and problem list. Problem list updated.   Objective  Blood pressure 114/72, height 5\' 6"  (1.676 m), weight 174 lb (78.9 kg), last menstrual period 09/15/2019, unknown if currently breastfeeding. Pregravid weight 135 lb (61.2 kg) Total Weight Gain 39 lb (17.7 kg) Urinalysis:      Fetal Status:     Movement: Present     General:  Alert, oriented and cooperative. Patient is in no acute distress.  Skin: Skin is warm and dry. No rash noted.   Cardiovascular: Normal heart rate noted  Respiratory: Normal respiratory effort, no problems with respiration noted  Abdomen: Soft, gravid, appropriate for gestational age. Pain/Pressure: Absent     Pelvic:  Cervical exam deferred        Extremities: Normal range of motion.     ental Status: Normal mood and affect. Normal behavior. Normal judgment and thought content.   09/17/2019 OB Follow Up  Result Date: 02/10/2020 Patient Name: Michelle Massey DOB: 07-22-1990 MRN: 1990 ULTRASOUND REPORT Location: [facility] Date of Service: 02/10/2020 Indications:growth/afi Findings: 02/12/2020 intrauterine  pregnancy is visualized with FHR at 131 BPM. Biometrics give an (U/S) Gestational age of [redacted]w[redacted]d and an (U/S) EDD of 05/01/2020; this correlates with the clinically established Estimated Date of Delivery: 05/07/20. Fetal presentation is Cephalic. Placenta: posterior. Grade: 2 AFI: 16.8 cm Growth percentile is 55.3%.  AC percentile is 60.5%. EFW: 1135 g  ( 2 lb 8 oz ) Impression: 1. [redacted]w[redacted]d Viable Singleton Intrauterine pregnancy previously established criteria. 2. Growth is 55.3 %ile.  AFI is 16.8 cm. Recommendations: 1.Clinical correlation with the patient's History and Physical Exam. [redacted]w[redacted]d, RT There is a singleton gestation with normal amniotic fluid volume. The fetal biometry correlates with established dating.  Limited fetal anatomy was performed.The visualized fetal anatomical survey appears within normal limits within the resolution of ultrasound as described above.  It must be noted that a normal ultrasound is unable to rule out fetal aneuploidy.  Michelle Artis, MD, Michelle Massey [facility], [facility] 02/10/2020, 11:33 AM     Assessment   29 y.o. G1P0000 at [redacted]w[redacted]d by  05/07/2020, by Ultrasound presenting for routine prenatal visit  Plan   pregnancy 1  Problems (from 09/14/19 to present)    Problem Noted Resolved   Supervision of high risk pregnancy, antepartum 12/01/2019 by 01/31/2020, MD No   Overview Addendum 01/13/2020 12:19 PM by 01/15/2020, MD    Clinic [facility] Prenatal Labs  Dating Michelle Massey 9 wks at [facility] Blood type: O/Positive/-- (08/09 06-12-1976)   Genetic Screen   NIPS: normal XY  Antibody:Negative (08/09 06-12-1976)  Anatomic 5093  complete, low lying placenta Rubella: 3.64 (08/09 0958)  Varicella: immune   GTT      Third trimester:  RPR: Non Reactive (08/09 0958)   Rhogam Not needed HBsAg: Negative (08/09 0958)  TDaP vaccine                        Flu Shot: 01/13/2020 HIV: Non Reactive (08/09 0958)   Baby Food    breast                            GBS:     Contraception  Pap:12/01/19- ASCUS HPV+  CBB  No Lupus  CS/VBAC N/A   Support Person FOB Not involved.  Mother is support person.      High risk diagnoses: Lupus in pregnancy History of tobacco use- stopped May 2021        Previous Version   Systemic lupus complicating pregnancy (HCC) 12/01/2019 by Michelle Mustard, MD No   Overview Addendum 01/13/2020 12:20 PM by Michelle Milch, MD    Initiate weekly NST's at 32 weeks  Start daily 81 mg ASA- discussed 23 weeks      Previous Version       Gestational age appropriate obstetric precautions including but not limited to vaginal bleeding, contractions, leaking of fluid and fetal movement were reviewed in detail with the patient.    Return in about 2 weeks (around 02/24/2020) for ROB.  Michelle Austria, MD, Michelle Massey [facility], [facility] 02/10/2020, 11:21 AM

## 2020-02-11 LAB — 28 WEEK RH+PANEL
Basophils Absolute: 0 10*3/uL (ref 0.0–0.2)
Basos: 0 %
EOS (ABSOLUTE): 0.1 10*3/uL (ref 0.0–0.4)
Eos: 0 %
Gestational Diabetes Screen: 145 mg/dL — ABNORMAL HIGH (ref 65–139)
HIV Screen 4th Generation wRfx: NONREACTIVE
Hematocrit: 32.8 % — ABNORMAL LOW (ref 34.0–46.6)
Hemoglobin: 10.9 g/dL — ABNORMAL LOW (ref 11.1–15.9)
Immature Grans (Abs): 0.1 10*3/uL (ref 0.0–0.1)
Immature Granulocytes: 1 %
Lymphocytes Absolute: 2.6 10*3/uL (ref 0.7–3.1)
Lymphs: 19 %
MCH: 29.9 pg (ref 26.6–33.0)
MCHC: 33.2 g/dL (ref 31.5–35.7)
MCV: 90 fL (ref 79–97)
Monocytes Absolute: 0.7 10*3/uL (ref 0.1–0.9)
Monocytes: 5 %
Neutrophils Absolute: 10.1 10*3/uL — ABNORMAL HIGH (ref 1.4–7.0)
Neutrophils: 75 %
Platelets: 414 10*3/uL (ref 150–450)
RBC: 3.65 x10E6/uL — ABNORMAL LOW (ref 3.77–5.28)
RDW: 12.8 % (ref 11.7–15.4)
RPR Ser Ql: NONREACTIVE
WBC: 13.5 10*3/uL — ABNORMAL HIGH (ref 3.4–10.8)

## 2020-02-16 ENCOUNTER — Other Ambulatory Visit: Payer: Self-pay | Admitting: Obstetrics and Gynecology

## 2020-02-16 DIAGNOSIS — R7309 Other abnormal glucose: Secondary | ICD-10-CM

## 2020-02-16 NOTE — Progress Notes (Unsigned)
3 hour

## 2020-02-20 ENCOUNTER — Other Ambulatory Visit: Payer: Medicaid Other

## 2020-02-20 ENCOUNTER — Other Ambulatory Visit: Payer: Self-pay

## 2020-02-20 DIAGNOSIS — R7309 Other abnormal glucose: Secondary | ICD-10-CM

## 2020-02-21 LAB — GESTATIONAL GLUCOSE TOLERANCE
Glucose, Fasting: 81 mg/dL (ref 65–94)
Glucose, GTT - 1 Hour: 128 mg/dL (ref 65–179)
Glucose, GTT - 2 Hour: 98 mg/dL (ref 65–154)
Glucose, GTT - 3 Hour: 129 mg/dL (ref 65–139)

## 2020-02-25 ENCOUNTER — Encounter: Payer: Self-pay | Admitting: Advanced Practice Midwife

## 2020-02-25 ENCOUNTER — Other Ambulatory Visit: Payer: Self-pay

## 2020-02-25 ENCOUNTER — Ambulatory Visit (INDEPENDENT_AMBULATORY_CARE_PROVIDER_SITE_OTHER): Payer: Medicaid Other | Admitting: Advanced Practice Midwife

## 2020-02-25 VITALS — BP 122/74 | Wt 181.0 lb

## 2020-02-25 DIAGNOSIS — O99891 Other specified diseases and conditions complicating pregnancy: Secondary | ICD-10-CM

## 2020-02-25 DIAGNOSIS — O0993 Supervision of high risk pregnancy, unspecified, third trimester: Secondary | ICD-10-CM

## 2020-02-25 DIAGNOSIS — M329 Systemic lupus erythematosus, unspecified: Secondary | ICD-10-CM

## 2020-02-25 DIAGNOSIS — Z3A29 29 weeks gestation of pregnancy: Secondary | ICD-10-CM

## 2020-02-25 NOTE — Progress Notes (Signed)
No vb. No lof.  

## 2020-02-25 NOTE — Progress Notes (Signed)
Routine Prenatal Care Visit  Subjective  Michelle Massey is a 29 y.o. G1P0000 at [redacted]w[redacted]d being seen today for ongoing prenatal care.  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  ----------------------------------------------------------------------------------- Patient reports no complaints. We discussed continued healthy pregnancy diet and physical activity to avoid excessive weight gain.  Contractions: Not present. Vag. Bleeding: None.  Movement: Present. Leaking Fluid denies.  ----------------------------------------------------------------------------------- The following portions of the patient's history were reviewed and updated as appropriate: allergies, current medications, past family history, past medical history, past social history, past surgical history and problem list. Problem list updated.  Objective  Blood pressure 122/74, weight 181 lb (82.1 kg), last menstrual period 09/15/2019, unknown if currently breastfeeding. Pregravid weight 135 lb (61.2 kg) Total Weight Gain 46 lb (20.9 kg) Urinalysis: Urine Protein    Urine Glucose    Fetal Status: Fetal Heart Rate (bpm): 146 Fundal Height: 30 cm Movement: Present     General:  Alert, oriented and cooperative. Patient is in no acute distress.  Skin: Skin is warm and dry. No rash noted.   Cardiovascular: Normal heart rate noted  Respiratory: Normal respiratory effort, no problems with respiration noted  Abdomen: Soft, gravid, appropriate for gestational age. Pain/Pressure: Absent     Pelvic:  Cervical exam deferred        Extremities: Normal range of motion.  Edema: None  Mental Status: Normal mood and affect. Normal behavior. Normal judgment and thought content.   Assessment   29 y.o. G1P0000 at [redacted]w[redacted]d by  05/07/2020, by  Ultrasound presenting for routine prenatal visit  Plan   pregnancy 1  Problems (from 09/14/19 to present)    Problem Noted Resolved   Supervision of high risk pregnancy, antepartum 12/01/2019 by Nadara Mustard, MD No   Overview Addendum 01/13/2020 12:19 PM by [name], MD    Clinic [facility] Prenatal Labs  Dating [place] 9 wks at [facility] Blood type: O/Positive/-- (08/09 2376)   Genetic Screen   NIPS: normal XY  Antibody:Negative (08/09 2831)  Anatomic [place]  complete, low lying placenta Rubella: 3.64 (08/09 0958)  Varicella: immune   GTT      Third trimester:  RPR: Non Reactive (08/09 0958)   Rhogam Not needed HBsAg: Negative (08/09 0958)   TDaP vaccine                        Flu Shot: 01/13/2020 HIV: Non Reactive (08/09 0958)   Baby Food    breast                            GBS:   Contraception  Pap:12/01/19- ASCUS HPV+  CBB  No Lupus  CS/VBAC N/A   Support Person FOB Not involved.  Mother is support person.      High risk diagnoses: Lupus in pregnancy History of tobacco use- stopped May 2021        Previous Version   Systemic lupus complicating pregnancy (HCC) 12/01/2019 by Nadara Mustard, MD No   Overview Addendum 01/13/2020 12:20 PM by [name], MD    Initiate weekly NST's at 32 weeks  Start daily 81 mg ASA- discussed 23 weeks      Previous Version       Preterm labor symptoms  and general obstetric precautions including but not limited to vaginal bleeding, contractions, leaking of fluid and fetal movement were reviewed in detail with the patient.    Return in about 2 weeks (around 03/10/2020) for rob.  Tresea Mall, CNM 02/25/2020 11:23 AM

## 2020-03-10 ENCOUNTER — Ambulatory Visit (INDEPENDENT_AMBULATORY_CARE_PROVIDER_SITE_OTHER): Payer: Medicaid Other | Admitting: Obstetrics and Gynecology

## 2020-03-10 ENCOUNTER — Encounter: Payer: Self-pay | Admitting: Obstetrics and Gynecology

## 2020-03-10 ENCOUNTER — Other Ambulatory Visit: Payer: Self-pay

## 2020-03-10 VITALS — BP 120/78 | Wt 178.0 lb

## 2020-03-10 DIAGNOSIS — M329 Systemic lupus erythematosus, unspecified: Secondary | ICD-10-CM

## 2020-03-10 DIAGNOSIS — O99891 Other specified diseases and conditions complicating pregnancy: Secondary | ICD-10-CM

## 2020-03-10 DIAGNOSIS — O0993 Supervision of high risk pregnancy, unspecified, third trimester: Secondary | ICD-10-CM

## 2020-03-10 DIAGNOSIS — Z3A31 31 weeks gestation of pregnancy: Secondary | ICD-10-CM

## 2020-03-10 NOTE — Patient Instructions (Signed)
For heartburn: Use Prilosec, Nexium, or Prevacid (or generic equivalents) in the morning about 30-60 minutes before food or drink   Heartburn During Pregnancy Heartburn is a type of pain or discomfort that can happen in the throat or chest. It is often described as a burning sensation. Heartburn is common during pregnancy because:  A hormone (progesterone) that is released during pregnancy may relax the valve (lower esophageal sphincter, or LES) that separates the esophagus from the stomach. This allows stomach acid to move up into the esophagus, causing heartburn.  The uterus gets larger and pushes up on the stomach, which pushes more acid into the esophagus. This is especially true in the later stages of pregnancy. Heartburn usually goes away or gets better after giving birth. What are the causes? Heartburn is caused by stomach acid backing up into the esophagus (reflux). Reflux can be triggered by:  Changing hormone levels.  Large meals.  Certain foods and beverages, such as coffee, chocolate, onions, and peppermint.  Exercise.  Increased stomach acid production. What increases the risk? You are more likely to experience heartburn during pregnancy if you:  Had heartburn prior to becoming pregnant.  Have been pregnant more than once before.  Are overweight or obese. The likelihood that you will get heartburn also increases as you get farther along in your pregnancy, especially during the last trimester. What are the signs or symptoms? Symptoms of this condition include:  Burning pain in the chest or lower throat.  Bitter taste in the mouth.  Coughing.  Problems swallowing.  Vomiting.  Hoarse voice.  Asthma. Symptoms may get worse when you lie down or bend over. Symptoms are often worse at night. How is this diagnosed? This condition is diagnosed based on:  Your medical history.  Your symptoms.  Blood tests to check for a certain type of bacteria associated  with heartburn.  Whether taking heartburn medicine relieves your symptoms.  Examination of the stomach and esophagus using a tube with a light and camera on the end (endoscopy). How is this treated? Treatment varies depending on how severe your symptoms are. Your health care provider may recommend:  Over-the-counter medicines (antacids or acid reducers) for mild heartburn.  Prescription medicines to decrease stomach acid or to protect your stomach lining.  Certain changes in your diet.  Raising the head of your bed so it is higher than the foot of the bed. This helps prevent stomach acid from backing up into the esophagus when you are lying down. Follow these instructions at home: Eating and drinking  Do not drink alcohol during your pregnancy.  Identify foods and beverages that make your symptoms worse, and avoid them.  Beverages that you may want to avoid include: ? Coffee and tea (with or without caffeine). ? Energy drinks and sports drinks. ? Carbonated drinks or sodas. ? Citrus fruit juices.  Foods that you may want to avoid include: ? Chocolate and cocoa. ? Peppermint and mint flavorings. ? Garlic, onions, and horseradish. ? Spicy and acidic foods, including peppers, chili powder, curry powder, vinegar, hot sauces, and barbecue sauce. ? Citrus fruits, such as oranges, lemons, and limes. ? Tomato-based foods, such as red sauce, chili, and salsa. ? Fried and fatty foods, such as donuts, french fries, potato chips, and high-fat dressings. ? High-fat meats, such as hot dogs, cold cuts, sausage, ham, and bacon. ? High-fat dairy items, such as whole milk, butter, and cheese.  Eat small, frequent meals instead of large meals.  Avoid drinking  large amounts of liquid with your meals.  Avoid eating meals during the 2-3 hours before bedtime.  Avoid lying down right after you eat.  Do not exercise right after you eat. Medicines  Take over-the-counter and prescription  medicines only as told by your health care provider.  Do not take aspirin, ibuprofen, or other NSAIDs unless your health care provider tells you to do that.  You may be instructed to avoid medicines that contain sodium bicarbonate. General instructions   If directed, raise the head of your bed about 6 inches (15 cm) by putting blocks under the legs. Sleeping with more pillows does not effectively relieve heartburn because it only changes the position of your head.  Do not use any products that contain nicotine or tobacco, such as cigarettes and e-cigarettes. If you need help quitting, ask your health care provider.  Wear loose-fitting clothing.  Try to reduce your stress, such as with yoga or meditation. If you need help managing stress, ask your health care provider.  Maintain a healthy weight. If you are overweight, work with your health care provider to safely lose weight.  Keep all follow-up visits as told by your health care provider. This is important. Contact a health care provider if:  You develop new symptoms.  Your symptoms do not improve with treatment.  You have unexplained weight loss.  You have difficulty swallowing.  You make loud sounds when you breathe (wheeze).  You have a cough that does not go away.  You have frequent heartburn for more than 2 weeks.  You have nausea or vomiting that does not get better with treatment.  You have pain in your abdomen. Get help right away if:  You have severe chest pain that spreads to your arm, neck, or jaw.  You feel sweaty, dizzy, or light-headed.  You have shortness of breath.  You have pain when swallowing.  You vomit, and your vomit looks like blood or coffee grounds.  Your stool is bloody or black. This information is not intended to replace advice given to you by your health care provider. Make sure you discuss any questions you have with your health care provider. Document Revised: 07/09/2017 Document  Reviewed: 12/27/2015 Elsevier Patient Education  2020 [employer].

## 2020-03-10 NOTE — Progress Notes (Signed)
Routine Prenatal Care Visit  Subjective  Michelle Massey is a 29 y.o. G1P0000 at [redacted]w[redacted]d being seen today for ongoing prenatal care.  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  ----------------------------------------------------------------------------------- Patient reports acid reflux. Has been taking sucralfate and is not working well.   Contractions: Not present. Vag. Bleeding: None.  Movement: Present. Leaking Fluid denies.  ----------------------------------------------------------------------------------- The following portions of the patient's history were reviewed and updated as appropriate: allergies, current medications, past family history, past medical history, past social history, past surgical history and problem list. Problem list updated.  Objective  Blood pressure 120/78, weight 178 lb (80.7 kg), last menstrual period 09/15/2019, unknown if currently breastfeeding. Pregravid weight 135 lb (61.2 kg) Total Weight Gain 43 lb (19.5 kg) Urinalysis: Urine Protein    Urine Glucose    Fetal Status: Fetal Heart Rate (bpm): 145 Fundal Height: 32 cm Movement: Present     General:  Alert, oriented and cooperative. Patient is in no acute distress.  Skin: Skin is warm and dry. No rash noted.   Cardiovascular: Normal heart rate noted  Respiratory: Normal respiratory effort, no problems with respiration noted  Abdomen: Soft, gravid, appropriate for gestational age. Pain/Pressure: Absent     Pelvic:  Cervical exam deferred        Extremities: Normal range of motion.  Edema: None  Mental Status: Normal mood and affect. Normal behavior. Normal judgment and thought content.   Assessment   29 y.o. G1P0000 at [redacted]w[redacted]d by  05/07/2020, by Ultrasound presenting for routine prenatal  visit  Plan   pregnancy 1  Problems (from 09/14/19 to present)    Problem Noted Resolved   Supervision of high risk pregnancy, antepartum 12/01/2019 by Nadara Mustard, MD No   Overview Addendum 01/13/2020 12:19 PM by [name], MD    [facility]inic [facility] Prenatal Labs  Dating [place] 9 wks at [facility] Blood type: O/Positive/-- (08/09 4098)   Genetic Screen   NIPS: normal XY  Antibody:Negative (08/09 1191)  Anatomic [place]  complete, low lying placenta Rubella: 3.64 (08/09 0958)  Varicella: immune   GTT      Third trimester:  RPR: Non Reactive (08/09 0958)   Rhogam Not needed HBsAg: Negative (08/09 0958)   TDaP vaccine                        Flu Shot: 01/13/2020 HIV: Non Reactive (08/09 0958)   Baby Food    breast                            GBS:   Contraception  Pap:12/01/19- ASCUS HPV+  CBB  No Lupus  CS/VBAC N/A   Support Person FOB Not involved.  Mother is support person.      High risk diagnoses: Lupus in pregnancy History of tobacco use- stopped May 2021        Previous Version   Systemic lupus complicating pregnancy (HCC) 12/01/2019 by Nadara Mustard, MD No   Overview Addendum 01/13/2020 12:20 PM by [name], MD    Initiate weekly NST's at 32 weeks  Start daily 81 mg ASA- discussed 23 weeks      Previous Version       Preterm labor symptoms and general obstetric precautions  including but not limited to vaginal bleeding, contractions, leaking of fluid and fetal movement were reviewed in detail with the patient. Please refer to After Visit Summary for other counseling recommendations.   - discussed safe OTC meds for heartburn.   Return in about 1 week (around 03/17/2020) for Growth u/s and ROB w NST (1-2 weeks).   Thomasene Mohair, MD, [facility], [facility] 03/10/2020 9:38 AM

## 2020-03-17 ENCOUNTER — Ambulatory Visit (INDEPENDENT_AMBULATORY_CARE_PROVIDER_SITE_OTHER): Payer: Medicaid Other | Admitting: Advanced Practice Midwife

## 2020-03-17 ENCOUNTER — Encounter: Payer: Self-pay | Admitting: Advanced Practice Midwife

## 2020-03-17 ENCOUNTER — Other Ambulatory Visit: Payer: Self-pay

## 2020-03-17 ENCOUNTER — Ambulatory Visit (INDEPENDENT_AMBULATORY_CARE_PROVIDER_SITE_OTHER): Payer: Medicaid Other

## 2020-03-17 DIAGNOSIS — O99891 Other specified diseases and conditions complicating pregnancy: Secondary | ICD-10-CM

## 2020-03-17 DIAGNOSIS — O0993 Supervision of high risk pregnancy, unspecified, third trimester: Secondary | ICD-10-CM | POA: Diagnosis not present

## 2020-03-17 DIAGNOSIS — M329 Systemic lupus erythematosus, unspecified: Secondary | ICD-10-CM

## 2020-03-17 DIAGNOSIS — O0990 Supervision of high risk pregnancy, unspecified, unspecified trimester: Secondary | ICD-10-CM | POA: Diagnosis not present

## 2020-03-17 DIAGNOSIS — Z3A32 32 weeks gestation of pregnancy: Secondary | ICD-10-CM

## 2020-03-17 LAB — FETAL NONSTRESS TEST

## 2020-03-17 NOTE — Progress Notes (Signed)
ROB/Growth/NST- no concerns

## 2020-03-17 NOTE — Progress Notes (Signed)
Routine Prenatal Care Visit  Subjective  Michelle Massey is a 29 y.o. G1P0000 at [redacted]w[redacted]d being seen today for ongoing prenatal care.  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  ----------------------------------------------------------------------------------- Patient reports no complaints.   Contractions: Not present. Vag. Bleeding: None.  Movement: Present. Leaking Fluid denies.  ----------------------------------------------------------------------------------- The following portions of the patient's history were reviewed and updated as appropriate: allergies, current medications, past family history, past medical history, past social history, past surgical history and problem list. Problem list updated.  Objective  Blood pressure 94/66, weight 185 lb (83.9 kg), last menstrual period 09/15/2019, unknown if currently breastfeeding. Pregravid weight 135 lb (61.2 kg) Total Weight Gain 50 lb (22.7 kg) Urinalysis: Urine Protein    Urine Glucose    Fetal Status: Fetal Heart Rate (bpm): 140   Movement: Present      Growth scan: 55.7%, AC 81.3%, 4 pounds 13 ounces, AFI 14.8, cephalic  NST: reactive 20 minute tracing, 140 bpm, moderate variability, +accelerations, -decelerations  General:  Alert, oriented and cooperative. Patient is in no acute distress.  Skin: Skin is warm and dry. No rash noted.   Cardiovascular: Normal heart rate noted  Respiratory: Normal respiratory effort, no problems with respiration noted  Abdomen: Soft, gravid, appropriate for gestational age. Pain/Pressure: Absent     Pelvic:  Cervical exam deferred        Extremities: Normal range of motion.  Edema: None  Mental Status: Normal mood and affect. Normal behavior. Normal judgment and thought content.    Assessment   29 y.o. G1P0000 at [redacted]w[redacted]d by  05/07/2020, by Ultrasound presenting for routine prenatal visit  Plan   pregnancy 1  Problems (from 09/14/19 to present)    Problem Noted Resolved   Supervision of high risk pregnancy, antepartum 12/01/2019 by Nadara Mustard, MD No   Overview Addendum 01/13/2020 12:19 PM by [name], MD    [facility]inic [facility] Prenatal Labs  Dating [place] 9 wks at [facility] Blood type: O/Positive/-- (08/09 6237)   Genetic Screen   NIPS: normal XY  Antibody:Negative (08/09 6283)  Anatomic [place]  complete, low lying placenta Rubella: 3.64 (08/09 0958)  Varicella: immune   GTT      Third trimester:  RPR: Non Reactive (08/09 0958)   Rhogam Not needed HBsAg: Negative (08/09 0958)   TDaP vaccine                        Flu Shot: 01/13/2020 HIV: Non Reactive (08/09 0958)   Baby Food    breast                            GBS:   Contraception  Pap:12/01/19- ASCUS HPV+  CBB  No Lupus  CS/VBAC N/A   Support Person FOB Not involved.  Mother is support person.      High risk diagnoses: Lupus in pregnancy History of tobacco use- stopped May 2021        Previous Version   Systemic lupus complicating pregnancy (HCC) 12/01/2019 by Nadara Mustard, MD No   Overview Addendum 01/13/2020 12:20 PM by [name], MD    Initiate weekly NST's at 32 weeks  Start daily 81 mg ASA- discussed 23 weeks  Previous Version       Preterm labor symptoms and general obstetric precautions including but not limited to vaginal bleeding, contractions, leaking of fluid and fetal movement were reviewed in detail with the patient. Please refer to After Visit Summary for other counseling recommendations.  Weekly NSTs  Return in about 1 week (around 03/24/2020) for scheduled appointment.  Tresea Mall, CNM 03/17/2020 2:31 PM

## 2020-03-24 ENCOUNTER — Other Ambulatory Visit: Payer: Self-pay

## 2020-03-24 ENCOUNTER — Ambulatory Visit (INDEPENDENT_AMBULATORY_CARE_PROVIDER_SITE_OTHER): Payer: Medicaid Other | Admitting: Obstetrics

## 2020-03-24 DIAGNOSIS — O0990 Supervision of high risk pregnancy, unspecified, unspecified trimester: Secondary | ICD-10-CM

## 2020-03-24 NOTE — Progress Notes (Signed)
Routine Prenatal Care Visit  Subjective  Michelle Massey is a 29 y.o. G1P0000 at [redacted]w[redacted]d being seen today for ongoing prenatal care.  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  ----------------------------------------------------------------------------------- Patient reports no complaints.  She has commenced weekly NSTs secondary to her Lupus.. She is not taking daily ASA. Marland KitchenContractions: Not present. Vag. Bleeding: None.  Movement: Present. Leaking Fluid denies.  ----------------------------------------------------------------------------------- The following portions of the patient's history were reviewed and updated as appropriate: allergies, current medications, past family history, past medical history, past social history, past surgical history and problem list. Problem list updated.  Objective  Blood pressure 116/70, weight 189 lb (85.7 kg), last menstrual period 09/15/2019, unknown if currently breastfeeding. Pregravid weight 135 lb (61.2 kg) Total Weight Gain 54 lb (24.5 kg) Urinalysis: Urine Protein    Urine Glucose    Fetal Status:     Movement: Present     General:  Alert, oriented and cooperative. Patient is in no acute distress.  Skin: Skin is warm and dry. No rash noted.   Cardiovascular: Normal heart rate noted  Respiratory: Normal respiratory effort, no problems with respiration noted  Abdomen: Soft, gravid, appropriate for gestational age. Pain/Pressure: Absent     Pelvic:  Cervical exam deferred        Extremities: Normal range of motion.     Mental Status: Normal mood and affect. Normal behavior. Normal judgment and thought content.   Assessment   29 y.o. G1P0000 at [redacted]w[redacted]d by  05/07/2020, by Ultrasound presenting for routine prenatal visit  Plan   pregnancy  1  Problems (from 09/14/19 to present)    Problem Noted Resolved   Supervision of high risk pregnancy, antepartum 12/01/2019 by Nadara Mustard, MD No   Overview Addendum 03/24/2020  2:45 PM by Mirna Mires, CNM    [facility] Prenatal Labs  Dating [place] 9 wks at [facility] Blood type: O/Positive/-- (08/09 5621)   Genetic Screen   NIPS: normal XY  Antibody:Negative (08/09 3086)  Anatomic [place]  complete, low lying placenta that is resolved  Rubella: 3.64 (08/09 0958)  Varicella: immune   GTT      Third trimester:  RPR: Non Reactive (08/09 0958)   Rhogam Not needed HBsAg: Negative (08/09 0958)   TDaP vaccine                        Flu Shot: 01/13/2020 HIV: Non Reactive (08/09 0958)   Baby Food    breast                            GBS:   Contraception  Pap:12/01/19- ASCUS HPV+  CBB  No Lupus  CS/VBAC N/A   Support Person FOB Not involved.  Mother is support person.      High risk diagnoses: Lupus in pregnancy History of tobacco use- stopped May 2021        Previous Version   Systemic lupus complicating pregnancy (HCC) 12/01/2019 by Nadara Mustard, MD No   Overview Addendum 01/13/2020 12:20 PM by [name], MD    Initiate weekly NST's at 32 weeks  Start daily 81 mg ASA- discussed 23 weeks      Previous Version       Preterm labor  symptoms and general obstetric precautions including but not limited to vaginal bleeding, contractions, leaking of fluid and fetal movement were reviewed in detail with the patient. Please refer to After Visit Summary for other counseling recommendations.  Continuing to take daily ASA  Return in about 1 week (around 03/31/2020) for return OB, needs weekly NSTs (Lupus).  Mirna Mires, CNM  03/24/2020 2:45 PM

## 2020-03-24 NOTE — Progress Notes (Signed)
No vb. No lof. NST today °

## 2020-03-31 ENCOUNTER — Other Ambulatory Visit: Payer: Self-pay

## 2020-03-31 ENCOUNTER — Encounter: Payer: Self-pay | Admitting: Obstetrics and Gynecology

## 2020-03-31 ENCOUNTER — Ambulatory Visit (INDEPENDENT_AMBULATORY_CARE_PROVIDER_SITE_OTHER): Payer: Medicaid Other | Admitting: Obstetrics and Gynecology

## 2020-03-31 VITALS — BP 115/70 | Ht 64.0 in | Wt 189.0 lb

## 2020-03-31 DIAGNOSIS — O0990 Supervision of high risk pregnancy, unspecified, unspecified trimester: Secondary | ICD-10-CM | POA: Diagnosis not present

## 2020-03-31 DIAGNOSIS — Z23 Encounter for immunization: Secondary | ICD-10-CM | POA: Diagnosis not present

## 2020-03-31 DIAGNOSIS — M329 Systemic lupus erythematosus, unspecified: Secondary | ICD-10-CM

## 2020-03-31 DIAGNOSIS — O99891 Other specified diseases and conditions complicating pregnancy: Secondary | ICD-10-CM

## 2020-03-31 DIAGNOSIS — Z3A34 34 weeks gestation of pregnancy: Secondary | ICD-10-CM

## 2020-03-31 LAB — FETAL NONSTRESS TEST

## 2020-03-31 NOTE — Progress Notes (Signed)
NST and ROB

## 2020-03-31 NOTE — Patient Instructions (Signed)
Third Trimester of Pregnancy The third trimester is from week 28 through week 40 (months 7 through 9). The third trimester is a time when the unborn baby (fetus) is growing rapidly. At the end of the ninth month, the fetus is about 20 inches in length and weighs 6-10 pounds. Body changes during your third trimester Your body will continue to go through many changes during pregnancy. The changes vary from woman to woman. During the third trimester:  Your weight will continue to increase. You can expect to gain 25-35 pounds (11-16 kg) by the end of the pregnancy.  You may begin to get stretch marks on your hips, abdomen, and breasts.  You may urinate more often because the fetus is moving lower into your pelvis and pressing on your bladder.  You may develop or continue to have heartburn. This is caused by increased hormones that slow down muscles in the digestive tract.  You may develop or continue to have constipation because increased hormones slow digestion and cause the muscles that push waste through your intestines to relax.  You may develop hemorrhoids. These are swollen veins (varicose veins) in the rectum that can itch or be painful.  You may develop swollen, bulging veins (varicose veins) in your legs.  You may have increased body aches in the pelvis, back, or thighs. This is due to weight gain and increased hormones that are relaxing your joints.  You may have changes in your hair. These can include thickening of your hair, rapid growth, and changes in texture. Some women also have hair loss during or after pregnancy, or hair that feels dry or thin. Your hair will most likely return to normal after your baby is born.  Your breasts will continue to grow and they will continue to become tender. A yellow fluid (colostrum) may leak from your breasts. This is the first milk you are producing for your baby.  Your belly button may stick out.  You may notice more swelling in your hands,  face, or ankles.  You may have increased tingling or numbness in your hands, arms, and legs. The skin on your belly may also feel numb.  You may feel short of breath because of your expanding uterus.  You may have more problems sleeping. This can be caused by the size of your belly, increased need to urinate, and an increase in your body's metabolism.  You may notice the fetus "dropping," or moving lower in your abdomen (lightening).  You may have increased vaginal discharge.  You may notice your joints feel loose and you may have pain around your pelvic bone. What to expect at prenatal visits You will have prenatal exams every 2 weeks until week 36. Then you will have weekly prenatal exams. During a routine prenatal visit:  You will be weighed to make sure you and the baby are growing normally.  Your blood pressure will be taken.  Your abdomen will be measured to track your baby's growth.  The fetal heartbeat will be listened to.  Any test results from the previous visit will be discussed.  You may have a cervical check near your due date to see if your cervix has softened or thinned (effaced).  You will be tested for Group B streptococcus. This happens between 35 and 37 weeks. Your health care provider may ask you:  What your birth plan is.  How you are feeling.  If you are feeling the baby move.  If you have had any abnormal   symptoms, such as leaking fluid, bleeding, severe headaches, or abdominal cramping.  If you are using any tobacco products, including cigarettes, chewing tobacco, and electronic cigarettes.  If you have any questions. Other tests or screenings that may be performed during your third trimester include:  Blood tests that check for low iron levels (anemia).  Fetal testing to check the health, activity level, and growth of the fetus. Testing is done if you have certain medical conditions or if there are problems during the pregnancy.  Nonstress test  (NST). This test checks the health of your baby to make sure there are no signs of problems, such as the baby not getting enough oxygen. During this test, a belt is placed around your belly. The baby is made to move, and its heart rate is monitored during movement. What is false labor? False labor is a condition in which you feel small, irregular tightenings of the muscles in the womb (contractions) that usually go away with rest, changing position, or drinking water. These are called Braxton Hicks contractions. Contractions may last for hours, days, or even weeks before true labor sets in. If contractions come at regular intervals, become more frequent, increase in intensity, or become painful, you should see your health care provider. What are the signs of labor?  Abdominal cramps.  Regular contractions that start at 10 minutes apart and become stronger and more frequent with time.  Contractions that start on the top of the uterus and spread down to the lower abdomen and back.  Increased pelvic pressure and dull back pain.  A watery or bloody mucus discharge that comes from the vagina.  Leaking of amniotic fluid. This is also known as your "water breaking." It could be a slow trickle or a gush. Let your health care provider know if it has a color or strange odor. If you have any of these signs, call your health care provider right away, even if it is before your due date. Follow these instructions at home: Medicines  Follow your health care provider's instructions regarding medicine use. Specific medicines may be either safe or unsafe to take during pregnancy.  Take a prenatal vitamin that contains at least 600 micrograms (mcg) of folic acid.  If you develop constipation, try taking a stool softener if your health care provider approves. Eating and drinking   Eat a balanced diet that includes fresh fruits and vegetables, whole grains, good sources of protein such as meat, eggs, or tofu,  and low-fat dairy. Your health care provider will help you determine the amount of weight gain that is right for you.  Avoid raw meat and uncooked cheese. These carry germs that can cause birth defects in the baby.  If you have low calcium intake from food, talk to your health care provider about whether you should take a daily calcium supplement.  Eat four or five small meals rather than three large meals a day.  Limit foods that are high in fat and processed sugars, such as fried and sweet foods.  To prevent constipation: ? Drink enough fluid to keep your urine clear or pale yellow. ? Eat foods that are high in fiber, such as fresh fruits and vegetables, whole grains, and beans. Activity  Exercise only as directed by your health care provider. Most women can continue their usual exercise routine during pregnancy. Try to exercise for 30 minutes at least 5 days a week. Stop exercising if you experience uterine contractions.  Avoid heavy lifting.  Do   not exercise in extreme heat or humidity, or at high altitudes.  Wear low-heel, comfortable shoes.  Practice good posture.  You may continue to have sex unless your health care provider tells you otherwise. Relieving pain and discomfort  Take frequent breaks and rest with your legs elevated if you have leg cramps or low back pain.  Take warm sitz baths to soothe any pain or discomfort caused by hemorrhoids. Use hemorrhoid cream if your health care provider approves.  Wear a good support bra to prevent discomfort from breast tenderness.  If you develop varicose veins: ? Wear support pantyhose or compression stockings as told by your healthcare provider. ? Elevate your feet for 15 minutes, 3-4 times a day. Prenatal care  Write down your questions. Take them to your prenatal visits.  Keep all your prenatal visits as told by your health care provider. This is important. Safety  Wear your seat belt at all times when driving.  Make  a list of emergency phone numbers, including numbers for family, friends, the hospital, and police and fire departments. General instructions  Avoid cat litter boxes and soil used by cats. These carry germs that can cause birth defects in the baby. If you have a cat, ask someone to clean the litter box for you.  Do not travel far distances unless it is absolutely necessary and only with the approval of your health care provider.  Do not use hot tubs, steam rooms, or saunas.  Do not drink alcohol.  Do not use any products that contain nicotine or tobacco, such as cigarettes and e-cigarettes. If you need help quitting, ask your health care provider.  Do not use any medicinal herbs or unprescribed drugs. These chemicals affect the formation and growth of the baby.  Do not douche or use tampons or scented sanitary pads.  Do not cross your legs for long periods of time.  To prepare for the arrival of your baby: ? Take prenatal classes to understand, practice, and ask questions about labor and delivery. ? Make a trial run to the hospital. ? Visit the hospital and tour the maternity area. ? Arrange for maternity or paternity leave through employers. ? Arrange for family and friends to take care of pets while you are in the hospital. ? Purchase a rear-facing car seat and make sure you know how to install it in your car. ? Pack your hospital bag. ? Prepare the baby's nursery. Make sure to remove all pillows and stuffed animals from the baby's crib to prevent suffocation.  Visit your dentist if you have not gone during your pregnancy. Use a soft toothbrush to brush your teeth and be gentle when you floss. Contact a health care provider if:  You are unsure if you are in labor or if your water has broken.  You become dizzy.  You have mild pelvic cramps, pelvic pressure, or nagging pain in your abdominal area.  You have lower back pain.  You have persistent nausea, vomiting, or  diarrhea.  You have an unusual or bad smelling vaginal discharge.  You have pain when you urinate. Get help right away if:  Your water breaks before 37 weeks.  You have regular contractions less than 5 minutes apart before 37 weeks.  You have a fever.  You are leaking fluid from your vagina.  You have spotting or bleeding from your vagina.  You have severe abdominal pain or cramping.  You have rapid weight loss or weight gain.  You have   shortness of breath with chest pain.  You notice sudden or extreme swelling of your face, hands, ankles, feet, or legs.  Your baby makes fewer than 10 movements in 2 hours.  You have severe headaches that do not go away when you take medicine.  You have vision changes. Summary  The third trimester is from week 28 through week 40, months 7 through 9. The third trimester is a time when the unborn baby (fetus) is growing rapidly.  During the third trimester, your discomfort may increase as you and your baby continue to gain weight. You may have abdominal, leg, and back pain, sleeping problems, and an increased need to urinate.  During the third trimester your breasts will keep growing and they will continue to become tender. A yellow fluid (colostrum) may leak from your breasts. This is the first milk you are producing for your baby.  False labor is a condition in which you feel small, irregular tightenings of the muscles in the womb (contractions) that eventually go away. These are called Braxton Hicks contractions. Contractions may last for hours, days, or even weeks before true labor sets in.  Signs of labor can include: abdominal cramps; regular contractions that start at 10 minutes apart and become stronger and more frequent with time; watery or bloody mucus discharge that comes from the vagina; increased pelvic pressure and dull back pain; and leaking of amniotic fluid. This information is not intended to replace advice given to you by your  health care provider. Make sure you discuss any questions you have with your health care provider. Document Revised: 08/01/2018 Document Reviewed: 05/16/2016 Elsevier Patient Education  2020 [employer]  

## 2020-03-31 NOTE — Progress Notes (Signed)
Routine Prenatal Care Visit  Subjective  Michelle Massey is a 29 y.o. G1P0000 at [redacted]w[redacted]d being seen today for ongoing prenatal care.  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  ----------------------------------------------------------------------------------- Patient reports no complaints.   Contractions: Not present. Vag. Bleeding: None.  Movement: Present. Denies leaking of fluid.  ----------------------------------------------------------------------------------- The following portions of the patient's history were reviewed and updated as appropriate: allergies, current medications, past family history, past medical history, past social history, past surgical history and problem list. Problem list updated.   Objective  Blood pressure 115/70, height 5\' 4"  (1.626 m), weight 189 lb (85.7 kg), last menstrual period 09/15/2019, unknown if currently breastfeeding. Pregravid weight 135 lb (61.2 kg) Total Weight Gain 54 lb (24.5 kg) Urinalysis:      Fetal Status:     Movement: Present     General:  Alert, oriented and cooperative. Patient is in no acute distress.  Skin: Skin is warm and dry. No rash noted.   Cardiovascular: Normal heart rate noted  Respiratory: Normal respiratory effort, no problems with respiration noted  Abdomen: Soft, gravid, appropriate for gestational age. Pain/Pressure: Absent     Pelvic:  Cervical exam deferred        Extremities: Normal range of motion.     Mental Status: Normal mood and affect. Normal behavior. Normal judgment and thought content.     Assessment   29 y.o. G1P0000 at [redacted]w[redacted]d by  05/07/2020, by Ultrasound presenting for routine prenatal visit  Plan   pregnancy 1  Problems (from 09/14/19 to present)    Problem Noted Resolved    Supervision of high risk pregnancy, antepartum 12/01/2019 by 01/31/2020, MD No   Overview Addendum 03/31/2020  1:42 PM by 14/11/2019, MD    Clinic [facility]renatal [facility]  Dating [name] 9 wks at [facility] Blood type: O/Positive/-- (08/09 06-12-1976)   Genetic Screen   NIPS: normal XY  Antibody:Negative (08/09 06-12-1976)  Anatomic 0932  complete, low lying placenta that is resolved  Rubella: 3.64 (08/09 0958)  Varicella: immune   GTT      Third trimester: 145 3hr GTT normal RPR: Non Reactive (08/09 0958)   Rhogam Not needed HBsAg: Negative (08/09 0958)   TDaP vaccine                        Flu Shot: 01/13/2020 HIV: Non Reactive (08/09 0958)   Baby Food    breast                            GBS:   Contraception  Pap:12/01/19- ASCUS HPV+  CBB  No Lupus  CS/VBAC N/A   Support Person FOB Not involved.  Mother is support person.      High risk diagnoses: Lupus in pregnancy History of tobacco use- stopped May 2021        Previous Version   Systemic lupus complicating pregnancy (HCC) 12/01/2019 by 01/31/2020, MD No   Overview Addendum 01/13/2020 12:20 PM by 01/15/2020, MD    Initiate weekly NST's at 32 weeks  Start daily 81 mg ASA- discussed 23 weeks      Previous Version      NST: 130 bpm baseline, moderate variability, 15x15 accelerations,  no decelerations. TDAP today Discussed birth control options  Gestational age appropriate obstetric precautions including but not limited to vaginal bleeding, contractions, leaking of fluid and fetal movement were reviewed in detail with the patient.    Return in about 1 week (around 04/07/2020) for ROB/ NST/ [place]- add [place] to next visit. [name] MD [facility], [facility] 03/31/2020, 1:53 PM

## 2020-04-07 ENCOUNTER — Encounter: Payer: Self-pay | Admitting: Obstetrics and Gynecology

## 2020-04-07 ENCOUNTER — Other Ambulatory Visit: Payer: Self-pay

## 2020-04-07 ENCOUNTER — Ambulatory Visit (INDEPENDENT_AMBULATORY_CARE_PROVIDER_SITE_OTHER): Payer: Medicaid Other

## 2020-04-07 ENCOUNTER — Ambulatory Visit (INDEPENDENT_AMBULATORY_CARE_PROVIDER_SITE_OTHER): Payer: Medicaid Other | Admitting: Obstetrics and Gynecology

## 2020-04-07 ENCOUNTER — Encounter: Payer: Medicaid Other | Admitting: Obstetrics & Gynecology

## 2020-04-07 VITALS — BP 110/62 | Ht 66.0 in | Wt 187.0 lb

## 2020-04-07 DIAGNOSIS — O99891 Other specified diseases and conditions complicating pregnancy: Secondary | ICD-10-CM

## 2020-04-07 DIAGNOSIS — Z3A35 35 weeks gestation of pregnancy: Secondary | ICD-10-CM

## 2020-04-07 DIAGNOSIS — O0993 Supervision of high risk pregnancy, unspecified, third trimester: Secondary | ICD-10-CM

## 2020-04-07 DIAGNOSIS — O0990 Supervision of high risk pregnancy, unspecified, unspecified trimester: Secondary | ICD-10-CM | POA: Diagnosis not present

## 2020-04-07 DIAGNOSIS — M329 Systemic lupus erythematosus, unspecified: Secondary | ICD-10-CM

## 2020-04-07 LAB — POCT URINALYSIS DIPSTICK OB
Glucose, UA: NEGATIVE
POC,PROTEIN,UA: NEGATIVE

## 2020-04-07 NOTE — Progress Notes (Signed)
Routine Prenatal Care Visit  Subjective  Michelle Massey is a 29 y.o. G1P0000 at [redacted]w[redacted]d being seen today for ongoing prenatal care.  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  ----------------------------------------------------------------------------------- Patient reports no complaints.   Contractions: Not present. Vag. Bleeding: None.  Movement: Present. Denies leaking of fluid.  ----------------------------------------------------------------------------------- The following portions of the patient's history were reviewed and updated as appropriate: allergies, current medications, past family history, past medical history, past social history, past surgical history and problem list. Problem list updated.   Objective  Blood pressure 110/62, height 5\' 6"  (1.676 m), weight 187 lb (84.8 kg), last menstrual period 09/15/2019, unknown if currently breastfeeding. Pregravid weight 135 lb (61.2 kg) Total Weight Gain 52 lb (23.6 kg) Urinalysis:      Fetal Status: Fetal Heart Rate (bpm): 140 Fundal Height: 36 cm Movement: Present     General:  Alert, oriented and cooperative. Patient is in no acute distress.  Skin: Skin is warm and dry. No rash noted.   Cardiovascular: Normal heart rate noted  Respiratory: Normal respiratory effort, no problems with respiration noted  Abdomen: Soft, gravid, appropriate for gestational age. Pain/Pressure: Absent     Pelvic:  Cervical exam deferred        Extremities: Normal range of motion.  Edema: None  ental Status: Normal mood and affect. Normal behavior. Normal judgment and thought content.     Assessment   29 y.o. G1P0000 at [redacted]w[redacted]d by  05/07/2020, by Ultrasound presenting for routine prenatal visit  Plan   pregnancy 1  Problems (from  09/14/19 to present)    Problem Noted Resolved   Supervision of high risk pregnancy, antepartum 12/01/2019 by 01/31/2020, MD No   Overview Addendum 03/31/2020  1:54 PM by 14/11/2019, MD    Clinic [facility]  Dating [name] 9 wks at [facility] Blood type: O/Positive/-- (08/09 06-12-1976)   Genetic Screen   NIPS: normal XY  Antibody:Negative (08/09 06-12-1976)  Anatomic 3664  complete, low lying placenta that is resolved  Rubella: 3.64 (08/09 0958)  Varicella: immune   GTT      Third trimester: 145 3hr GTT normal RPR: Non Reactive (08/09 0958)   Rhogam Not needed HBsAg: Negative (08/09 0958)   TDaP vaccine     03/31/2020                   Flu Shot: 01/13/2020 HIV: Non Reactive (08/09 0958)   Baby Food    breast                            GBS:   Contraception  depo vs IUD Pap:12/01/19- ASCUS HPV+  CBB  No Lupus  CS/VBAC N/A   Support Person FOB Not involved.  Mother is support person.      High risk diagnoses: Lupus in pregnancy History of tobacco use- stopped May 2021        Previous Version   Systemic lupus complicating pregnancy (HCC) 12/01/2019 by 01/31/2020, MD No   Overview Addendum 01/13/2020 12:20 PM by 01/15/2020, MD    Initiate weekly NST's at 32 weeks  Start daily 81 mg ASA- discussed 23 weeks      Previous Version      -  Reviewed growth [place] - BPP 8/8 -GBS at next visit  Preterm labor precautions including but not limited to vaginal bleeding, contractions, leaking of fluid and fetal movement were reviewed in detail with the patient.    Return in about 1 week (around 04/14/2020) for ROB with NST.  Zipporah Plants, CNM, MSN [facility], [facility] 04/07/2020, 3:04 PM

## 2020-04-14 ENCOUNTER — Ambulatory Visit (INDEPENDENT_AMBULATORY_CARE_PROVIDER_SITE_OTHER): Payer: Medicaid Other | Admitting: Obstetrics & Gynecology

## 2020-04-14 ENCOUNTER — Other Ambulatory Visit: Payer: Self-pay

## 2020-04-14 ENCOUNTER — Encounter: Payer: Self-pay | Admitting: Obstetrics & Gynecology

## 2020-04-14 VITALS — BP 110/70 | Wt 191.0 lb

## 2020-04-14 DIAGNOSIS — O99891 Other specified diseases and conditions complicating pregnancy: Secondary | ICD-10-CM

## 2020-04-14 DIAGNOSIS — Z3A36 36 weeks gestation of pregnancy: Secondary | ICD-10-CM | POA: Diagnosis not present

## 2020-04-14 DIAGNOSIS — O0993 Supervision of high risk pregnancy, unspecified, third trimester: Secondary | ICD-10-CM | POA: Diagnosis not present

## 2020-04-14 DIAGNOSIS — M329 Systemic lupus erythematosus, unspecified: Secondary | ICD-10-CM

## 2020-04-14 DIAGNOSIS — Z3685 Encounter for antenatal screening for Streptococcus B: Secondary | ICD-10-CM

## 2020-04-14 NOTE — Patient Instructions (Signed)
Thank you for choosing [facility]. As part of our ongoing efforts to improve patient experience, we would appreciate your feedback. Please fill out the short survey that you will receive by mail or MyChart. Your opinion is important to us! -Dr Annice Jolly  Recommendations to boost your immunity to prevent illness such as viral flu and colds, including covid19, are as follows:       - - -  Vitamin K2 and Vitamin D3  - - - Take Vitamin K2 at 200-300 mcg daily (usually 2-3 pills daily of the over the counter formulation). Take Vitamin D3 at 3000-4000 U daily (usually 3-4 pills daily of the over the counter formulation). Studies show that these two at high normal levels in your system are very effective in keeping your immunity so strong and protective that you will be unlikely to contract viral illness such as those listed above.  Dr Jessicalynn Deshong  

## 2020-04-14 NOTE — Progress Notes (Signed)
  Subjective  Fetal Movement? yes Contractions? no Leaking Fluid? no Vaginal Bleeding? no  Objective  BP 110/70   Wt 191 lb (86.6 kg)   LMP 09/15/2019   BMI 30.83 kg/m  General: NAD Pumonary: no increased work of breathing Abdomen: gravid, non-tender Extremities: no edema Psychiatric: mood appropriate, affect full  A NST procedure was performed with FHR monitoring and a normal baseline established, appropriate time of 20-40 minutes of evaluation, and accels >2 seen w 15x15 characteristics.  Results show a REACTIVE NST.   Assessment  29 y.o. G1P0000 at [redacted]w[redacted]d by  05/07/2020, by Ultrasound presenting for routine prenatal visit  Plan   Problem List Items Addressed This Visit      Other   Systemic lupus complicating pregnancy (HCC)    Other Visit Diagnoses    [redacted] weeks gestation of pregnancy    -  Primary   Supervision of high risk pregnancy in third trimester       Encounter for antenatal screening for Streptococcus B       Relevant Orders   Culture, beta strep (group b only)      pregnancy 1  Problems (from 09/14/19 to present)    Problem Noted Resolved   Supervision of high risk pregnancy, antepartum 12/01/2019 by Nadara Mustard, MD No   Overview Addendum 03/31/2020  1:54 PM by [name], MD    Clinic [facility]atal [facility]  Dating [place] 9 wks at [facility] Blood type: O/Positive/-- (08/09 6160)   Genetic Screen   NIPS: normal XY  Antibody:Negative (08/09 7371)  Anatomic [place]  complete, low lying placenta that is resolved  Rubella: 3.64 (08/09 0958)  Varicella: immune   GTT      Third trimester: 145 3hr GTT normal RPR: Non Reactive (08/09 0958)   Rhogam Not needed HBsAg: Negative (08/09 0958)   TDaP vaccine     03/31/2020                   Flu Shot: 01/13/2020 HIV: Non Reactive (08/09 0958)   Baby Food    breast                            GBS: today  Contraception  depo vs IUD Pap:12/01/19- ASCUS HPV+  CBB  No Lupus  CS/VBAC N/A   Support Person FOB Not involved.   Mother is support person.      High risk diagnoses: Lupus in pregnancy History of tobacco use- stopped May 2021        Annamarie Major, MD, [facility], [facility] 04/14/2020  2:24 PM

## 2020-04-18 LAB — CULTURE, BETA STREP (GROUP B ONLY): Strep Gp B Culture: NEGATIVE

## 2020-04-21 ENCOUNTER — Encounter: Payer: Self-pay | Admitting: Obstetrics and Gynecology

## 2020-04-21 ENCOUNTER — Other Ambulatory Visit: Payer: Self-pay

## 2020-04-21 ENCOUNTER — Ambulatory Visit (INDEPENDENT_AMBULATORY_CARE_PROVIDER_SITE_OTHER): Payer: Medicaid Other | Admitting: Obstetrics and Gynecology

## 2020-04-21 VITALS — BP 110/60 | Wt 193.0 lb

## 2020-04-21 DIAGNOSIS — O0990 Supervision of high risk pregnancy, unspecified, unspecified trimester: Secondary | ICD-10-CM

## 2020-04-21 DIAGNOSIS — Z3A37 37 weeks gestation of pregnancy: Secondary | ICD-10-CM

## 2020-04-21 LAB — POCT URINALYSIS DIPSTICK OB: Glucose, UA: NEGATIVE

## 2020-04-21 NOTE — Progress Notes (Signed)
Routine Prenatal Care Visit  Subjective  Michelle Massey is a 29 y.o. G1P0000 at [redacted]w[redacted]d being seen today for ongoing prenatal care.  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  ----------------------------------------------------------------------------------- Patient reports no complaints.   Contractions: Not present. Vag. Bleeding: None.  Movement: Present. Denies leaking of fluid.  ----------------------------------------------------------------------------------- The following portions of the patient's history were reviewed and updated as appropriate: allergies, current medications, past family history, past medical history, past social history, past surgical history and problem list. Problem list updated.   Objective  Blood pressure 110/60, weight 193 lb (87.5 kg), last menstrual period 09/15/2019, unknown if currently breastfeeding. Pregravid weight 135 lb (61.2 kg) Total Weight Gain 58 lb (26.3 kg) Urinalysis:      Fetal Status: Fetal Heart Rate (bpm): RNST Fundal Height: 39 cm Movement: Present      NONSTRESS TEST INTERPRETATION  INDICATIONS: lupus FHR baseline: 135 RESULTS:  A NST procedure was performed with FHR monitoring and a normal baseline established, appropriate time of 20-40 minutes of evaluation, and accels >2 seen w 15x15 characteristics.  Results show a REACTIVE NST.    General:  Alert, oriented and cooperative. Patient is in no acute distress.  Skin: Skin is warm and dry. No rash noted.   Cardiovascular: Normal heart rate noted  Respiratory: Normal respiratory effort, no problems with respiration noted  Abdomen: Soft, gravid, appropriate for gestational age. Pain/Pressure: Absent     Pelvic:  Cervical exam deferred        Extremities: Normal range of  motion.  Edema: Mild pitting, slight indentation  ental Status: Normal mood and affect. Normal behavior. Normal judgment and thought content.     Assessment   29 y.o. G1P0000 at [redacted]w[redacted]d by  05/07/2020, by Ultrasound presenting for routine prenatal visit  Plan   pregnancy 1  Problems (from 09/14/19 to present)    Problem Noted Resolved   Supervision of high risk pregnancy, antepartum 12/01/2019 by Nadara Mustard, MD No   Overview Addendum 04/21/2020  3:18 PM by Zipporah Plants, CNM    Clinic [facility] Prenatal Labs  Dating [place] 9 wks at [facility] Blood type: O/Positive/-- (08/09 0092)   Genetic Screen   NIPS: normal XY  Antibody:Negative (08/09 3300)  Anatomic [place]  complete, low lying placenta that is resolved  Rubella: 3.64 (08/09 0958)  Varicella: immune   GTT      Third trimester: 145 3hr GTT normal RPR: Non Reactive (10/19 1049)   Rhogam Not needed HBsAg: Negative (08/09 0958)   TDaP vaccine     03/31/2020                   Flu Shot: 01/13/2020 HIV: Non Reactive (10/19 1049)   Baby Food    breast                            TMA:UQJFHLKT/-- (12/22 1433)  Contraception  depo vs IUD Pap:12/01/19- ASCUS HPV+  CBB  No Lupus  CS/VBAC N/A   Support Person FOB Not involved.  Mother is support person.      High risk diagnoses: Lupus in pregnancy History of tobacco use- stopped May 2021        Previous Version   Systemic lupus complicating pregnancy (HCC) 12/01/2019 by Tiburcio Pea,  Harrel Lemon, MD No   Overview Addendum 01/13/2020 12:20 PM by [name], MD    Initiate weekly NST's at 32 weeks  Start daily 81 mg ASA- discussed 23 weeks      Previous Version      -Discussed options for pain management in labor, reviewed birth wishes  Term labor precautions including but not limited to vaginal bleeding, contractions, leaking of fluid and fetal movement were reviewed in detail with the patient.    Return in about 1 week (around 04/28/2020) for ROB with NST.  Zipporah Plants, CNM, MSN [facility], [facility] 04/21/2020, 3:18 PM

## 2020-04-24 NOTE — L&D Delivery Note (Signed)
Delivery Note At 1:02 AM a viable female was delivered via Vaginal, Spontaneous (Presentation: Right Occiput Anterior).  APGAR: 9, 10; weight pending.   Placenta status: Spontaneous, Intact.  Cord: 3 vessels with the following complications: None.  Cord pH: n/a  Anesthesia: Epidural Episiotomy: None Lacerations: 2nd degree;Perineal;Vaginal Suture Repair: 3.0 vicryl Qnt. Blood Loss (mL):  Pending (EBL: 250 mL)  Mom to postpartum.  Baby to Couplet care / Skin to Skin.  Called to see patient.  Mom pushed to deliver a viable female infant.  The head followed by shoulders, which delivered without difficulty, and the rest of the body.  No nuchal cord noted.  Baby to mom's chest.  Cord clamped and cut after > 1 min delay.  Cord blood obtained.  Placenta delivered spontaneously, intact, with a 3-vessel cord.  Second degree perineal laceration repaired with 3-0 Vicryl in standard fashion.  All counts correct.  Hemostasis obtained with IV pitocin and fundal massage. EBL 250 mL.     Thomasene Mohair, MD 04/26/2020, 1:27 AM

## 2020-04-25 ENCOUNTER — Other Ambulatory Visit: Payer: Self-pay

## 2020-04-25 ENCOUNTER — Encounter: Payer: Self-pay | Admitting: Obstetrics and Gynecology

## 2020-04-25 ENCOUNTER — Inpatient Hospital Stay
Admission: EM | Admit: 2020-04-25 | Discharge: 2020-04-28 | DRG: 806 | Disposition: A | Discharge status: Home / Self Care | Payer: Medicaid Other | Attending: Obstetrics and Gynecology | Admitting: Obstetrics and Gynecology

## 2020-04-25 ENCOUNTER — Inpatient Hospital Stay: Payer: Medicaid Other | Admitting: Anesthesiology

## 2020-04-25 DIAGNOSIS — O99891 Other specified diseases and conditions complicating pregnancy: Secondary | ICD-10-CM | POA: Diagnosis not present

## 2020-04-25 DIAGNOSIS — M329 Systemic lupus erythematosus, unspecified: Secondary | ICD-10-CM | POA: Diagnosis not present

## 2020-04-25 DIAGNOSIS — Z3A38 38 weeks gestation of pregnancy: Secondary | ICD-10-CM | POA: Diagnosis not present

## 2020-04-25 DIAGNOSIS — Z20822 Contact with and (suspected) exposure to covid-19: Secondary | ICD-10-CM | POA: Diagnosis present

## 2020-04-25 DIAGNOSIS — Z87891 Personal history of nicotine dependence: Secondary | ICD-10-CM

## 2020-04-25 DIAGNOSIS — Z13220 Encounter for screening for lipoid disorders: Secondary | ICD-10-CM

## 2020-04-25 DIAGNOSIS — O0990 Supervision of high risk pregnancy, unspecified, unspecified trimester: Secondary | ICD-10-CM

## 2020-04-25 DIAGNOSIS — D62 Acute posthemorrhagic anemia: Secondary | ICD-10-CM | POA: Diagnosis not present

## 2020-04-25 DIAGNOSIS — O99892 Other specified diseases and conditions complicating childbirth: Principal | ICD-10-CM | POA: Diagnosis present

## 2020-04-25 DIAGNOSIS — L932 Other local lupus erythematosus: Secondary | ICD-10-CM | POA: Diagnosis present

## 2020-04-25 DIAGNOSIS — O4290 Premature rupture of membranes, unspecified as to length of time between rupture and onset of labor, unspecified weeks of gestation: Secondary | ICD-10-CM | POA: Diagnosis not present

## 2020-04-25 DIAGNOSIS — O9081 Anemia of the puerperium: Secondary | ICD-10-CM | POA: Diagnosis not present

## 2020-04-25 DIAGNOSIS — O26893 Other specified pregnancy related conditions, third trimester: Secondary | ICD-10-CM | POA: Diagnosis present

## 2020-04-25 LAB — CBC
HCT: 35.4 % — ABNORMAL LOW (ref 36.0–46.0)
Hemoglobin: 11.7 g/dL — ABNORMAL LOW (ref 12.0–15.0)
MCH: 28.1 pg (ref 26.0–34.0)
MCHC: 33.1 g/dL (ref 30.0–36.0)
MCV: 85.1 fL (ref 80.0–100.0)
Platelets: 321 10*3/uL (ref 150–400)
RBC: 4.16 MIL/uL (ref 3.87–5.11)
RDW: 15.7 % — ABNORMAL HIGH (ref 11.5–15.5)
WBC: 15.6 10*3/uL — ABNORMAL HIGH (ref 4.0–10.5)
nRBC: 0.1 % (ref 0.0–0.2)

## 2020-04-25 LAB — RESP PANEL BY RT-PCR (FLU A&B, COVID) ARPGX2
Influenza A by PCR: NEGATIVE
Influenza B by PCR: NEGATIVE
SARS Coronavirus 2 by RT PCR: NEGATIVE

## 2020-04-25 LAB — CHLAMYDIA/NGC RT PCR (ARMC ONLY)
Chlamydia Tr: NOT DETECTED
N gonorrhoeae: NOT DETECTED

## 2020-04-25 LAB — ABO/RH: ABO/RH(D): O POS

## 2020-04-25 MED ORDER — FENTANYL 2.5 MCG/ML W/ROPIVACAINE 0.15% IN NS 100 ML EPIDURAL (ARMC)
12.0000 mL/h | EPIDURAL | Status: DC
  Administered 2020-04-25: 12 mL/h via EPIDURAL

## 2020-04-25 MED ORDER — BUPIVACAINE HCL (PF) 0.25 % IJ SOLN
INTRAMUSCULAR | Status: DC | PRN
  Administered 2020-04-25: 5 mL via EPIDURAL

## 2020-04-25 MED ORDER — LIDOCAINE-EPINEPHRINE (PF) 1.5 %-1:200000 IJ SOLN
INTRAMUSCULAR | Status: DC | PRN
  Administered 2020-04-25: 4 mL via EPIDURAL

## 2020-04-25 MED ORDER — LACTATED RINGERS IV SOLN: INTRAVENOUS | Status: DC

## 2020-04-25 MED ORDER — AMMONIA AROMATIC IN INHA
RESPIRATORY_TRACT | Status: AC
  Filled 2020-04-25: qty 10

## 2020-04-25 MED ORDER — OXYTOCIN 10 UNIT/ML IJ SOLN
INTRAMUSCULAR | Status: AC
  Filled 2020-04-25: qty 2

## 2020-04-25 MED ORDER — OXYTOCIN-SODIUM CHLORIDE 30-0.9 UT/500ML-% IV SOLN
INTRAVENOUS | Status: AC
  Filled 2020-04-25: qty 1000

## 2020-04-25 MED ORDER — DIPHENHYDRAMINE HCL 50 MG/ML IJ SOLN: 12.5000 mg | INTRAMUSCULAR | Status: DC | PRN

## 2020-04-25 MED ORDER — PHENYLEPHRINE 40 MCG/ML (10ML) SYRINGE FOR IV PUSH (FOR BLOOD PRESSURE SUPPORT): 80.0000 ug | PREFILLED_SYRINGE | INTRAVENOUS | Status: DC | PRN

## 2020-04-25 MED ORDER — FENTANYL 2.5 MCG/ML W/ROPIVACAINE 0.15% IN NS 100 ML EPIDURAL (ARMC)
EPIDURAL | Status: AC
  Filled 2020-04-25: qty 100

## 2020-04-25 MED ORDER — SOD CITRATE-CITRIC ACID 500-334 MG/5ML PO SOLN: 30.0000 mL | ORAL | Status: DC | PRN

## 2020-04-25 MED ORDER — LACTATED RINGERS IV SOLN
500.0000 mL | Freq: Once | INTRAVENOUS | Status: AC
  Administered 2020-04-25: 500 mL via INTRAVENOUS

## 2020-04-25 MED ORDER — LIDOCAINE HCL (PF) 1 % IJ SOLN
INTRAMUSCULAR | Status: AC
  Filled 2020-04-25: qty 30

## 2020-04-25 MED ORDER — EPHEDRINE 5 MG/ML INJ: 10.0000 mg | INTRAVENOUS | Status: DC | PRN

## 2020-04-25 MED ORDER — OXYTOCIN-SODIUM CHLORIDE 30-0.9 UT/500ML-% IV SOLN: 2.5000 [IU]/h | INTRAVENOUS | Status: DC

## 2020-04-25 MED ORDER — MISOPROSTOL 25 MCG QUARTER TABLET
ORAL_TABLET | ORAL | Status: AC
  Filled 2020-04-25: qty 1

## 2020-04-25 MED ORDER — OXYTOCIN 10 UNIT/ML IJ SOLN: 10.0000 [IU] | Freq: Once | INTRAMUSCULAR | Status: DC

## 2020-04-25 MED ORDER — LACTATED RINGERS IV SOLN: 500.0000 mL | INTRAVENOUS | Status: DC | PRN

## 2020-04-25 MED ORDER — LIDOCAINE HCL (PF) 1 % IJ SOLN: 30.0000 mL | INTRAMUSCULAR | Status: DC | PRN

## 2020-04-25 MED ORDER — MISOPROSTOL 200 MCG PO TABS
ORAL_TABLET | ORAL | Status: AC
  Administered 2020-04-25: 25 ug via BUCCAL
  Filled 2020-04-25: qty 4

## 2020-04-25 MED ORDER — HYDROMORPHONE HCL 1 MG/ML IJ SOLN
0.5000 mg | INTRAMUSCULAR | Status: AC
  Administered 2020-04-25: 0.5 mg via INTRAVENOUS

## 2020-04-25 MED ORDER — MISOPROSTOL 25 MCG QUARTER TABLET: 25.0000 ug | ORAL_TABLET | Freq: Once | ORAL | Status: AC

## 2020-04-25 MED ORDER — HYDROMORPHONE HCL 1 MG/ML IJ SOLN
INTRAMUSCULAR | Status: AC
  Filled 2020-04-25: qty 1

## 2020-04-25 MED ORDER — TERBUTALINE SULFATE 1 MG/ML IJ SOLN: 0.2500 mg | Freq: Once | INTRAMUSCULAR | Status: DC | PRN

## 2020-04-25 MED ORDER — ONDANSETRON HCL 4 MG/2ML IJ SOLN
4.0000 mg | Freq: Four times a day (QID) | INTRAMUSCULAR | Status: DC | PRN
  Administered 2020-04-25: 4 mg via INTRAVENOUS
  Filled 2020-04-25: qty 2

## 2020-04-25 MED ORDER — LIDOCAINE HCL (PF) 1 % IJ SOLN
INTRAMUSCULAR | Status: DC | PRN
  Administered 2020-04-25: 3 mL via SUBCUTANEOUS

## 2020-04-25 MED ORDER — OXYTOCIN-SODIUM CHLORIDE 30-0.9 UT/500ML-% IV SOLN
1.0000 m[IU]/min | INTRAVENOUS | Status: DC
  Administered 2020-04-25 (×2): 1 m[IU]/min via INTRAVENOUS

## 2020-04-25 MED ORDER — OXYTOCIN BOLUS FROM INFUSION
333.0000 mL | Freq: Once | INTRAVENOUS | Status: AC
  Administered 2020-04-26: 333 mL via INTRAVENOUS

## 2020-04-25 NOTE — Anesthesia Preprocedure Evaluation (Signed)
Anesthesia Evaluation  Patient identified by MRN, date of birth, ID band Patient awake    Reviewed: Allergy & Precautions, H&P , NPO status , Patient's Chart, lab work & pertinent test results, reviewed documented beta blocker date and time   Airway Mallampati: II  TM Distance: >3 FB Neck ROM: full    Dental no notable dental hx. (+) Teeth Intact   Pulmonary neg pulmonary ROS, Current Smoker, former smoker,    Pulmonary exam normal breath sounds clear to auscultation       Cardiovascular Exercise Tolerance: Good negative cardio ROS   Rhythm:regular Rate:Normal     Neuro/Psych negative neurological ROS  negative psych ROS   GI/Hepatic negative GI ROS, Neg liver ROS,   Endo/Other  negative endocrine ROSdiabetes, Gestational  Renal/GU      Musculoskeletal   Abdominal   Peds  Hematology negative hematology ROS (+)   Anesthesia Other Findings   Reproductive/Obstetrics (+) Pregnancy                             Anesthesia Physical Anesthesia Plan  ASA: II  Anesthesia Plan: Epidural   Post-op Pain Management:    Induction:   PONV Risk Score and Plan:   Airway Management Planned:   Additional Equipment:   Intra-op Plan:   Post-operative Plan:   Informed Consent: I have reviewed the patients History and Physical, chart, labs and discussed the procedure including the risks, benefits and alternatives for the proposed anesthesia with the patient or authorized representative who has indicated his/her understanding and acceptance.       Plan Discussed with:   Anesthesia Plan Comments:         Anesthesia Quick Evaluation  

## 2020-04-25 NOTE — H&P (Signed)
OB History & Physical   History of Present Illness:  Chief Complaint: water brok  HPI:  Michelle Massey is a 30 y.o. G1P0000 female at [redacted]w[redacted]d dated by a 9 week ultrasound.  Her pregnancy has been complicated by late entry into care, discoid lupus with no recent flares.    She reports infrequent contractions.   She reports a gush of clear fluid at about 730 today. She has noted infrequent contractions.   She denies vaginal bleeding.   She reports fetal movement.    Total weight gain for pregnancy: 26.3 kg   Obstetrical Problem List: pregnancy 1  Problems (from 09/14/19 to present)    Problem Noted Resolved   Supervision of high risk pregnancy, antepartum 12/01/2019 by Gae Dry, MD No   Overview Addendum 04/21/2020  3:18 PM by Orlie Pollen, [place] Prenatal Labs  Dating [place] 9 wks at [facility] Blood type: O/Positive/-- (08/09 1660)   Genetic Screen   NIPS: normal XY  Antibody:Negative (08/09 6301)  Anatomic [place]  complete, low lying placenta that is resolved  Rubella: 3.64 (08/09 0958)  Varicella: immune   GTT      Third trimester: 145 3hr GTT normal RPR: Non Reactive (10/19 1049)   Rhogam Not needed HBsAg: Negative (08/09 0958)   TDaP vaccine     03/31/2020                   Flu Shot: 01/13/2020 HIV: Non Reactive (10/19 1049)   Baby Food    breast                            SWF:UXNATFTD/-- (12/22 1433)  Contraception  depo vs IUD Pap:12/01/19- ASCUS HPV+  CBB  No Lupus  CS/VBAC N/A   Support Person FOB Not involved.  Mother is support person.      High risk diagnoses: Lupus in pregnancy History of tobacco use- stopped May 2021        Previous Version   Systemic lupus complicating pregnancy ([place]) 12/01/2019 by Gae Dry, MD No   Overview Addendum 01/13/2020 12:20 PM by Homero Fellers, MD    Initiate weekly NST's at 32 weeks  Start daily 81 mg ASA- discussed 23 weeks      Previous Version       Maternal Medical History:   Past Medical History:   Diagnosis Date  . Collagen vascular disease ([place])   . Lupus ([place])   . Scoliosis     Past Surgical History:  Procedure Laterality Date  . NO PAST SURGERIES      No Known Allergies  Prior to Admission medications   Medication Sig Start Date End Date Taking? Authorizing Provider  aspirin EC 81 MG tablet Take 81 mg by mouth daily. Swallow whole.   Yes [provider]  omeprazole (PRILOSEC) 10 MG capsule Take 10 mg by mouth daily.   Yes [provider]  Prenatal Vit-Fe Fumarate-FA (PRENATAL MULTIVITAMIN) TABS tablet Take 1 tablet by mouth daily at 12 noon.   Yes [provider]  sucralfate (CARAFATE) 1 g tablet Take 1 tablet (1 g total) by mouth 4 (four) times daily. Patient not taking: No sig reported 01/13/20 01/12/21  Homero Fellers, MD    OB History  Gravida Para Term Preterm AB Living  1 0 0 0 0 0  SAB IAB Ectopic Multiple Live Births  0 0 0 0  0    # Outcome Date GA Lbr Len/2nd Weight Sex Delivery Anes PTL Lv  1 Current             Prenatal care site: [facility]  Social History: She  reports that she quit smoking about 2 years ago. Her smoking use included cigarettes. She started smoking about 9 years ago. She smoked 0.50 packs per day. She has never used smokeless tobacco. She reports previous alcohol use. She reports current drug use. Drug: Marijuana.  Family History: family history includes Asthma in her mother; Bipolar disorder in her sister; Cancer in her mother; Diabetes in her maternal great-grandfather; Hodgkin's lymphoma in her mother; Hypertension in her maternal great-grandfather and mother; Schizophrenia in her sister.   Review of Systems:  Review of Systems  Constitutional: Negative.   HENT: Negative.   Eyes: Negative.   Respiratory: Negative.   Cardiovascular: Negative.   Gastrointestinal: Negative.   Genitourinary: Negative.   Musculoskeletal: Negative.   Skin: Negative.   Neurological: Negative.    Psychiatric/Behavioral: Negative.      Physical Exam:  BP 125/79   Pulse (!) 108   Temp 98.1 F (36.7 C) (Oral)   Resp 16   Ht 5\' 6"  (1.676 m)   Wt 87.5 kg   LMP 09/15/2019   BMI 31.15 kg/m   Physical Exam Constitutional:      General: She is not in acute distress.    Appearance: Normal appearance. She is well-developed.  Genitourinary:     Genitourinary Comments: 1.5 cm per RN  HENT:     Head: Normocephalic and atraumatic.  Eyes:     General: No scleral icterus.    Conjunctiva/sclera: Conjunctivae normal.  Cardiovascular:     Rate and Rhythm: Normal rate and regular rhythm.     Heart sounds: No murmur heard. No friction rub. No gallop.   Pulmonary:     Effort: Pulmonary effort is normal. No respiratory distress.     Breath sounds: Normal breath sounds. No wheezing or rales.  Abdominal:     General: Bowel sounds are normal. There is no distension.     Palpations: Abdomen is soft. There is mass (gravid, NT).     Tenderness: There is no abdominal tenderness. There is no guarding or rebound.  Musculoskeletal:        General: Normal range of motion.     Cervical back: Normal range of motion and neck supple.  Neurological:     General: No focal deficit present.     Mental Status: She is alert and oriented to person, place, and time.     Cranial Nerves: No cranial nerve deficit.  Skin:    General: Skin is warm and dry.     Findings: No erythema.  Psychiatric:        Mood and Affect: Mood normal.        Behavior: Behavior normal.        Judgment: Judgment normal.     Baseline FHR: 135 beats/min   Variability: moderate   Accelerations: present   Decelerations: absent Contractions: present frequency: infrequent Overall assessment: cat 1  Fetal presentation: cephalic by leopolds.   Last ultrasound on 04/07/2020 report: Growth 63.3 %ile, AC 85,4 %ile (2,967 grams, 6 lb 9 oz), cephalic AFI 17.8 cm  Lab Results  Component Value Date   SARSCOV2NAA NEGATIVE  04/25/2020    Assessment:  Michelle Massey is a 30 y.o. G1P0000 female at [redacted]w[redacted]d with rupture membranes.   Plan:  1. Admit to Labor & Delivery  2. CBC, T&S, Clrs, IVF 3. GBS negative.   4. Fetwal well-being: reassuring 5. Give a few hours to await spontaneous rupture. Will augment if no spontaneous labor.    Thomasene Mohair, MD 04/25/2020 11:00 AM

## 2020-04-25 NOTE — OB Triage Note (Signed)
Pt come in c/o LOF at 0730 and describes as clear slime like fluid. No bleeding. No odor noticed. Denies HA, blurred vision, or other complaints at this time. Denies contraction type pain but states she does feel cramping in groin like menstrual period. Marlowe Sax

## 2020-04-25 NOTE — Anesthesia Procedure Notes (Signed)
Epidural Patient location during procedure: OB  Staffing Performed: anesthesiologist   Preanesthetic Checklist Completed: patient identified, IV checked, site marked, risks and benefits discussed, surgical consent, monitors and equipment checked, pre-op evaluation and timeout performed  Epidural Patient position: sitting Prep: Betadine Patient monitoring: heart rate, continuous pulse ox and blood pressure Approach: midline Location: L4-L5 Injection technique: LOR saline  Needle:  Needle type: Tuohy  Needle gauge: 17 G Needle length: 9 cm and 9 Needle insertion depth: 5 cm Catheter type: closed end flexible Catheter size: 19 Gauge Catheter at skin depth: 11 cm Test dose: negative and 1.5% lidocaine with Epi 1:200 K  Assessment Sensory level: T10 Events: blood not aspirated, injection not painful, no injection resistance, no paresthesia and negative IV test  Additional Notes   Patient tolerated the insertion well without complications.-SATD -IVTD. No paresthesia. Refer to [facility] nursing for VS and dosingReason for block:procedure for pain     

## 2020-04-26 ENCOUNTER — Encounter: Payer: Self-pay | Admitting: Obstetrics and Gynecology

## 2020-04-26 DIAGNOSIS — O99891 Other specified diseases and conditions complicating pregnancy: Secondary | ICD-10-CM

## 2020-04-26 DIAGNOSIS — O4290 Premature rupture of membranes, unspecified as to length of time between rupture and onset of labor, unspecified weeks of gestation: Secondary | ICD-10-CM

## 2020-04-26 DIAGNOSIS — M329 Systemic lupus erythematosus, unspecified: Secondary | ICD-10-CM

## 2020-04-26 DIAGNOSIS — Z3A38 38 weeks gestation of pregnancy: Secondary | ICD-10-CM

## 2020-04-26 LAB — CBC
HCT: 28.4 % — ABNORMAL LOW (ref 36.0–46.0)
Hemoglobin: 9.5 g/dL — ABNORMAL LOW (ref 12.0–15.0)
MCH: 28.2 pg (ref 26.0–34.0)
MCHC: 33.5 g/dL (ref 30.0–36.0)
MCV: 84.3 fL (ref 80.0–100.0)
Platelets: 301 10*3/uL (ref 150–400)
RBC: 3.37 MIL/uL — ABNORMAL LOW (ref 3.87–5.11)
RDW: 15.2 % (ref 11.5–15.5)
WBC: 22.6 10*3/uL — ABNORMAL HIGH (ref 4.0–10.5)
nRBC: 0 % (ref 0.0–0.2)

## 2020-04-26 LAB — RPR: RPR Ser Ql: NONREACTIVE

## 2020-04-26 MED ORDER — SENNOSIDES-DOCUSATE SODIUM 8.6-50 MG PO TABS
2.0000 | ORAL_TABLET | ORAL | Status: DC
  Administered 2020-04-27: 2 via ORAL
  Filled 2020-04-26 (×2): qty 2

## 2020-04-26 MED ORDER — COCONUT OIL OIL: 1.0000 "application " | TOPICAL_OIL | Status: DC | PRN

## 2020-04-26 MED ORDER — PRENATAL MULTIVITAMIN CH
1.0000 | ORAL_TABLET | Freq: Every day | ORAL | Status: DC
  Administered 2020-04-26 – 2020-04-27 (×2): 1 via ORAL
  Filled 2020-04-26 (×2): qty 1

## 2020-04-26 MED ORDER — ONDANSETRON HCL 4 MG/2ML IJ SOLN: 4.0000 mg | INTRAMUSCULAR | Status: DC | PRN

## 2020-04-26 MED ORDER — ACETAMINOPHEN 325 MG PO TABS
650.0000 mg | ORAL_TABLET | ORAL | Status: DC | PRN
  Administered 2020-04-26: 650 mg via ORAL
  Filled 2020-04-26 (×2): qty 2

## 2020-04-26 MED ORDER — DIPHENHYDRAMINE HCL 25 MG PO CAPS: 25.0000 mg | ORAL_CAPSULE | Freq: Four times a day (QID) | ORAL | Status: DC | PRN

## 2020-04-26 MED ORDER — FERROUS SULFATE 325 (65 FE) MG PO TABS
325.0000 mg | ORAL_TABLET | Freq: Two times a day (BID) | ORAL | Status: DC
  Administered 2020-04-26 – 2020-04-28 (×5): 325 mg via ORAL
  Filled 2020-04-26 (×5): qty 1

## 2020-04-26 MED ORDER — IBUPROFEN 600 MG PO TABS
600.0000 mg | ORAL_TABLET | Freq: Four times a day (QID) | ORAL | Status: DC
  Administered 2020-04-26 – 2020-04-28 (×9): 600 mg via ORAL
  Filled 2020-04-26 (×9): qty 1

## 2020-04-26 MED ORDER — ONDANSETRON HCL 4 MG PO TABS: 4.0000 mg | ORAL_TABLET | ORAL | Status: DC | PRN

## 2020-04-26 MED ORDER — BENZOCAINE-MENTHOL 20-0.5 % EX AERO
1.0000 "application " | INHALATION_SPRAY | CUTANEOUS | Status: DC | PRN
  Filled 2020-04-26: qty 56

## 2020-04-26 MED ORDER — HYDROCODONE-ACETAMINOPHEN 5-325 MG PO TABS: 1.0000 | ORAL_TABLET | Freq: Three times a day (TID) | ORAL | Status: DC | PRN

## 2020-04-26 MED ORDER — DIBUCAINE (PERIANAL) 1 % EX OINT: 1.0000 "application " | TOPICAL_OINTMENT | CUTANEOUS | Status: DC | PRN

## 2020-04-26 MED ORDER — SIMETHICONE 80 MG PO CHEW: 80.0000 mg | CHEWABLE_TABLET | ORAL | Status: DC | PRN

## 2020-04-26 MED ORDER — WITCH HAZEL-GLYCERIN EX PADS
1.0000 "application " | MEDICATED_PAD | CUTANEOUS | Status: DC | PRN
  Filled 2020-04-26: qty 100

## 2020-04-26 NOTE — Plan of Care (Signed)
Transferred to Room 337. Oriented to Room, Fall Prevention and POC. V/O. Assessment and Vs WNL.

## 2020-04-26 NOTE — Progress Notes (Signed)
Post Partum Day 0 Subjective: no complaints, up ad lib, voiding, tolerating PO and she has slept for a few hours since her delivery today at 0102. she has occasional "gushes' of bleeding, but her flow is otherwise light.  Objective: Blood pressure 113/70, pulse 80, temperature 98.6 F (37 C), temperature source Oral, resp. rate 18, height 5\' 6"  (1.676 m), weight 87.5 kg, last menstrual period 09/15/2019, SpO2 98 %, unknown if currently breastfeeding.  Physical Exam:  General: alert, cooperative, no distress and mildly obese Lochia: appropriate Uterine Fundus: firm Incision: healing well, only slight perineal edema DVT Evaluation: No evidence of DVT seen on physical exam. Negative Homan's sign. No cords or calf tenderness.  Recent Labs    04/25/20 1132 04/26/20 0620  HGB 11.7* 9.5*  HCT 35.4* 28.4*    Assessment/Plan: Plan for discharge tomorrow  Rest encouraged/ Lactation support PRN.   LOS: 1 day   06/24/20 04/26/2020, 10:05 AM

## 2020-04-26 NOTE — Lactation Note (Signed)
This note was copied from a baby's chart. Lactation Consultation Note  Patient Name: Michelle Massey [id number] Date: 04/26/2020 Reason for consult: Follow-up assessment;Primapara;Early term 37-38.6wks Age:32 hours  Maternal Data Formula Feeding for Exclusion: No Has patient been taught Hand Expression?: Yes Does the patient have breastfeeding experience prior to this delivery?: No  Michelle Massey has been sleepy today.  He has been more awake for the last 2 breast feeds.  Parents have had difficulty latching him on their own.  Nipples are slightly flat.  When compressed, they slightly invert at first, but seem to be more everted with each breast feed.  Michelle Massey started out breast feeding without nipple shield, but it took several attempts to obtain deep enough latch.  After he latched well he had good rhythmic suck that mom could feel as strong tug at the breast.  He sucked vigorously for about 6 minutes before falling asleep again.  Tried #20 nipple shield to see if he could sustain the latch longer for more productive sucking which seemed to work for a while longer.  FOB is very supportive.  Mom denies any breast or nipple pain.  Demonstrated feeding cues of Michelle Massey putting his hands to his mouth and encouraged mom to put him to the breast whenever he demonstrated hunger cues which mom is willing to do.  Hand out given on what to expect the first 4 days of life with feedings reviewing normal newborn stomach size, adequate intake and out put, supply and demand, normal course of lactation and routine newborn feeding patterns.  Lactation [employer] and LLL hand out given with contact numbers, informative web sites and support groups.  Lactation name and number has been written on white board and encouraged to call with any questions, concerns or assistance. Feeding Feeding Type: Breast Fed  LATCH Score Latch: Grasps breast easily, tongue down, lips flanged, rhythmical sucking.  Audible  Swallowing: A few with stimulation  Type of Nipple: Everted at rest and after stimulation  Comfort (Breast/Nipple): Soft / non-tender  Hold (Positioning): Assistance needed to correctly position infant at breast and maintain latch.  LATCH Score: 8  Interventions Interventions: Breast feeding basics reviewed;Assisted with latch;Skin to skin;Breast massage;Hand express;Reverse pressure;Breast compression;Adjust position;Support pillows;Position options  Lactation Tools Discussed/Used WIC Program: Yes   Consult Status Consult Status: PRN    Louis Meckel 04/26/2020, 7:59 PM

## 2020-04-26 NOTE — Discharge Summary (Signed)
Postpartum Discharge Summary   Patient Name: Michelle Massey DOB: [birth year] MRN: [id number]  Date of admission: 04/25/2020 Delivery date:04/26/2020  Delivering provider: Prentice Docker D  Date of discharge: 04/28/2020  Admitting diagnosis: Rupture of membranes with delay of delivery [O42.90] Intrauterine pregnancy: [redacted]w[redacted]d    Secondary diagnosis:  Active Problems:   Supervision of high risk pregnancy, antepartum   Systemic lupus complicating pregnancy ([place]   Encounter for postpartum care after hospital delivery   Encounter for care and examination of lactating mother  Additional problems: Acute blood loss anemia    Discharge diagnosis: Term Pregnancy Delivered                                              Post partum procedures:blood transfusion Augmentation: AROM and Cytotec Complications: None  Hospital course: Onset of Labor With Vaginal Delivery      30y.o. yo G1P0000 at 357w3das admitted in Latent Labor with rupture of membranes on 04/25/2020. Patient had an uncomplicated labor course as follows:  Membrane Rupture Time/Date: 7:30 AM ,04/25/2020   Delivery Method:Vaginal, Spontaneous  Episiotomy: None  Lacerations:  2nd degree;Perineal;Vaginal;Labial  Patient had an uncomplicated postpartum course.  She is ambulating, tolerating a regular diet, passing flatus, and urinating well. Patient is discharged home in stable condition on 04/28/20.  Newborn Data: Birth date:04/26/2020  Birth time:1:02 AM  Gender:Female  Living status:Living  Apgars:9 ,10  Weight:3530 g   Magnesium Sulfate received: No BMZ received: No Rhophylac:No MMR:No T-DaP:Given prenatally 03/31/2020 Flu: Yes on 01/13/2020 Transfusion:Yes  Physical exam  Vitals:   04/27/20 1849 04/27/20 1943 04/27/20 2316 04/28/20 0832  BP: 132/87 114/65 125/80 124/65  Pulse: (!) 106 97 (!) 107 85  Resp: _0 Temp: 98.8 F (37.1 C) 98.2 F (36.8 C) 97.9 F (36.6 C) 98.6 F (37 C)  TempSrc: Oral Oral Oral Oral   SpO2: 98% 99% 98% 100%  Weight:      Height:       General: alert, cooperative and no distress Lochia: appropriate Uterine Fundus: firm Incision: N/A DVT Evaluation: No evidence of DVT seen on physical exam. No significant calf/ankle edema. Labs: Lab Results  Component Value Date   WBC 13.8 (H) 04/28/2020   HGB 8.4 (L) 04/28/2020   HCT 25.1 (L) 04/28/2020   MCV 84.2 04/28/2020   PLT 294 04/28/2020   CMP Latest Ref Rng & Units 10/06/2019  Glucose 70 - 99 mg/dL 94  BUN 6 - 20 mg/dL 7  Creatinine 0.44 - 1.00 mg/dL 0.54  Sodium 135 - 145 mmol/L 134(L)  Potassium 3.5 - 5.1 mmol/L 3.6  Chloride 98 - 111 mmol/L 100  CO2 22 - 32 mmol/L 23  Calcium 8.9 - 10.3 mg/dL 9.5  Total Protein 6.5 - 8.1 g/dL 7.7  Total Bilirubin 0.3 - 1.2 mg/dL 0.7  Alkaline Phos 38 - 126 U/L 48  AST 15 - 41 U/L 22  ALT 0 - 44 U/L 24   Edinburgh Score: Edinburgh Postnatal Depression Scale Screening Tool 04/26/2020  I have been able to laugh and see the funny side of things. 0  I have looked forward with enjoyment to things. 0  I have blamed myself unnecessarily when things went wrong. 0  I have been anxious or worried for no good reason. 0  I have felt scared or panicky for  no good reason. 0  Things have been getting on top of me. 0  I have been so unhappy that I have had difficulty sleeping. 0  I have felt sad or miserable. 0  I have been so unhappy that I have been crying. 0  The thought of harming myself has occurred to me. 0  Edinburgh Postnatal Depression Scale Total 0      After visit meds:  Allergies as of 04/28/2020   No Known Allergies     Medication List    STOP taking these medications   aspirin EC 81 MG tablet   omeprazole 10 MG capsule Commonly known as: PRILOSEC   sucralfate 1 g tablet Commonly known as: Carafate     TAKE these medications   ferrous sulfate 325 (65 FE) MG tablet Take 1 tablet (325 mg total) by mouth every other day.   ibuprofen 600 MG tablet Commonly  known as: ADVIL Take 1 tablet (600 mg total) by mouth every 6 (six) hours.   prenatal multivitamin Tabs tablet Take 1 tablet by mouth daily at 12 noon.        Discharge home in stable condition Infant Feeding: Breast Infant Disposition:home with mother Discharge instruction: per After Visit Summary and Postpartum booklet. Activity: Advance as tolerated. Pelvic rest for 6 weeks.  Diet: routine diet Anticipated Birth Control: IUD vs Depo Postpartum Appointment:6 weeks Additional Postpartum F/U: none Future Appointments: No future appointments. Follow up Visit:  Follow-up Information    Will Bonnet, MD. Schedule an appointment as soon as possible for a visit in 6 week(s).   Specialty: Obstetrics and Gynecology Why: Six weeks postpartum Contact information: [address] [place] [address] [phone]              SIGNED:  Orlie Pollen, CNM MSN

## 2020-04-27 ENCOUNTER — Inpatient Hospital Stay: Payer: Medicaid Other

## 2020-04-27 DIAGNOSIS — D62 Acute posthemorrhagic anemia: Secondary | ICD-10-CM

## 2020-04-27 LAB — HEMOGLOBIN AND HEMATOCRIT, BLOOD
HCT: 24 % — ABNORMAL LOW (ref 36.0–46.0)
Hemoglobin: 8 g/dL — ABNORMAL LOW (ref 12.0–15.0)

## 2020-04-27 LAB — CBC
HCT: 20.4 % — ABNORMAL LOW (ref 36.0–46.0)
HCT: 19.3 % — ABNORMAL LOW (ref 36.0–46.0)
Hemoglobin: 6.6 g/dL — ABNORMAL LOW (ref 12.0–15.0)
Hemoglobin: 6.2 g/dL — ABNORMAL LOW (ref 12.0–15.0)
MCH: 27.7 pg (ref 26.0–34.0)
MCH: 27.8 pg (ref 26.0–34.0)
MCHC: 32.4 g/dL (ref 30.0–36.0)
MCHC: 32.1 g/dL (ref 30.0–36.0)
MCV: 85.7 fL (ref 80.0–100.0)
MCV: 86.5 fL (ref 80.0–100.0)
Platelets: 247 10*3/uL (ref 150–400)
Platelets: 247 10*3/uL (ref 150–400)
RBC: 2.38 MIL/uL — ABNORMAL LOW (ref 3.87–5.11)
RBC: 2.23 MIL/uL — ABNORMAL LOW (ref 3.87–5.11)
RDW: 15.4 % (ref 11.5–15.5)
RDW: 15.4 % (ref 11.5–15.5)
WBC: 18.4 10*3/uL — ABNORMAL HIGH (ref 4.0–10.5)
WBC: 17.4 10*3/uL — ABNORMAL HIGH (ref 4.0–10.5)
nRBC: 0 % (ref 0.0–0.2)
nRBC: 0 % (ref 0.0–0.2)

## 2020-04-27 LAB — PREPARE RBC (CROSSMATCH)

## 2020-04-27 MED ORDER — DIPHENHYDRAMINE HCL 25 MG PO CAPS
25.0000 mg | ORAL_CAPSULE | Freq: Once | ORAL | Status: AC
  Administered 2020-04-27: 25 mg via ORAL
  Filled 2020-04-27: qty 1

## 2020-04-27 MED ORDER — SODIUM CHLORIDE 0.9% IV SOLUTION: Freq: Once | INTRAVENOUS | Status: AC

## 2020-04-27 MED ORDER — ACETAMINOPHEN 325 MG PO TABS
650.0000 mg | ORAL_TABLET | Freq: Once | ORAL | Status: AC
  Administered 2020-04-27: 650 mg via ORAL
  Filled 2020-04-27: qty 2

## 2020-04-27 NOTE — Progress Notes (Signed)
Subjective:  Patient has returned from ultrasound. She denies any symptoms of blood loss anemia other than tiredness and current blood loss is minimal. She is tolerating a regular diet. Her pain is well controlled with PO medication. She is ambulating and voiding without difficulty. She reports breastfeeding is going well.   Objective:  Vital signs in last 24 hours: Temp:  [98.1 F (36.7 C)-98.5 F (36.9 C)] 98.1 F (36.7 C) (01/03 2214) Pulse Rate:  [79-102] 79 (01/04 0825) Resp:  [18-20] 18 (01/04 0825) BP: (107-130)/(61-85) 121/72 (01/04 0825) SpO2:  [99 %-100 %] 100 % (01/04 0825)    General: NAD Pulmonary: no increased work of breathing Abdomen: non-distended, non-tender, fundus firm at level of umbilicus Extremities: no edema, no erythema, no tenderness  Results for orders placed or performed during the hospital encounter of 04/25/20 (from the past 72 hour(s))  Resp Panel by RT-PCR (Flu A&B, Covid) Nasopharyngeal Swab     Status: None   Collection Time: 04/25/20  9:43 AM   Specimen: Nasopharyngeal Swab; Nasopharyngeal(NP) swabs in vial transport medium  Result Value Ref Range   SARS Coronavirus 2 by RT PCR NEGATIVE NEGATIVE    Comment: (NOTE) SARS-CoV-2 target nucleic acids are NOT DETECTED.  The SARS-CoV-2 RNA is generally detectable in upper respiratory specimens during the acute phase of infection. The lowest concentration of SARS-CoV-2 viral copies this assay can detect is 138 copies/mL. A negative result does not preclude SARS-Cov-2 infection and should not be used as the sole basis for treatment or other patient management decisions. A negative result may occur with  improper specimen collection/handling, submission of specimen other than nasopharyngeal swab, presence of viral mutation(s) within the areas targeted by this assay, and inadequate number of viral copies(<138 copies/mL). A negative result must be combined with clinical observations, patient history,  and epidemiological information. The expected result is Negative.  Fact Sheet for Patients:  BloggerCourse.com  Fact Sheet for Healthcare Providers:  SeriousBroker.it  This test is no t yet approved or cleared by the [place] FDA and  has been authorized for detection and/or diagnosis of SARS-CoV-2 by FDA under an Emergency Use Authorization (EUA). This EUA will remain  in effect (meaning this test can be used) for the duration of the COVID-19 declaration under Section 564(b)(1) of the Act, 21 U.S.C.section 360bbb-3(b)(1), unless the authorization is terminated  or revoked sooner.       Influenza A by PCR NEGATIVE NEGATIVE   Influenza B by PCR NEGATIVE NEGATIVE    Comment: (NOTE) The Xpert Xpress SARS-CoV-2/FLU/RSV plus assay is intended as an aid in the diagnosis of influenza from Nasopharyngeal swab specimens and should not be used as a sole basis for treatment. Nasal washings and aspirates are unacceptable for Xpert Xpress SARS-CoV-2/FLU/RSV testing.  Fact Sheet for Patients: BloggerCourse.com  Fact Sheet for Healthcare Providers: SeriousBroker.it  This test is not yet approved or cleared by the [place] FDA and has been authorized for detection and/or diagnosis of SARS-CoV-2 by FDA under an Emergency Use Authorization (EUA). This EUA will remain in effect (meaning this test can be used) for the duration of the COVID-19 declaration under Section 564(b)(1) of the Act, 21 U.S.C. section 360bbb-3(b)(1), unless the authorization is terminated or revoked.  Performed at [facility], [address] [place], [place] [address]   CBC     Status: Abnormal   Collection Time: 04/25/20 11:32 AM  Result Value Ref Range   WBC 15.6 (H) 4.0 - 10.5 K/uL  RBC 4.16 3.87 - 5.11 MIL/uL   Hemoglobin 11.7 (L) 12.0 - 15.0 g/dL   HCT 35.4 (L) 36.0 - 46.0 %   MCV 85.1  80.0 - 100.0 fL   MCH 28.1 26.0 - 34.0 pg   MCHC 33.1 30.0 - 36.0 g/dL   RDW 15.7 (H) 11.5 - 15.5 %   Platelets 321 150 - 400 K/uL   nRBC 0.1 0.0 - 0.2 %    Comment: Performed at [facility], [address] [place], [place] [address]  Type and screen [place]     Status: None (Preliminary result)   Collection Time: 04/25/20 11:32 AM  Result Value Ref Range   ABO/RH(D) O POS    Antibody Screen NEG    Sample Expiration 04/28/2020,2359    Unit Number [id number]    Blood Component Type RED CELLS,LR    Unit division 00    Status of Unit ALLOCATED    Transfusion Status OK TO TRANSFUSE    Crossmatch Result      Compatible Performed at [facility], [address] [place], [place] [address]    Unit Number [id number]    Blood Component Type RED CELLS,LR    Unit division 00    Status of Unit ALLOCATED    Transfusion Status OK TO TRANSFUSE    Crossmatch Result Compatible   RPR     Status: None   Collection Time: 04/25/20 11:32 AM  Result Value Ref Range   RPR Ser Ql NON REACTIVE NON REACTIVE    Comment: Performed at [facility], [address], [place], [place] [address]  [place] rt PCR [facility] only)     Status: None   Collection Time: 04/25/20 11:32 AM   Specimen: Urine  Result Value Ref Range   Specimen source GC/Chlam URINE, RANDOM    Chlamydia Tr NOT DETECTED NOT DETECTED   N gonorrhoeae NOT DETECTED NOT DETECTED    Comment: (NOTE) This CT/NG assay has not been evaluated in patients with a history of  hysterectomy. Performed at [facility], [address] [place], [place] [address]   ABO/Rh     Status: None   Collection Time: 04/25/20 12:33 PM  Result Value Ref Range   ABO/RH(D)      O POS Performed at [facility], [address] [place], [place] [address]   CBC     Status: Abnormal   Collection Time: 04/26/20  6:20 AM  Result Value Ref Range   WBC 22.6 (H) 4.0 - 10.5 K/uL   RBC  3.37 (L) 3.87 - 5.11 MIL/uL   Hemoglobin 9.5 (L) 12.0 - 15.0 g/dL   HCT 28.4 (L) 36.0 - 46.0 %   MCV 84.3 80.0 - 100.0 fL   MCH 28.2 26.0 - 34.0 pg   MCHC 33.5 30.0 - 36.0 g/dL   RDW 15.2 11.5 - 15.5 %   Platelets 301 150 - 400 K/uL   nRBC 0.0 0.0 - 0.2 %    Comment: Performed at [facility], [address] [place], [place] [address]  CBC     Status: Abnormal   Collection Time: 04/27/20  3:40 AM  Result Value Ref Range   WBC 18.4 (H) 4.0 - 10.5 K/uL   RBC 2.38 (L) 3.87 - 5.11 MIL/uL   Hemoglobin 6.6 (L) 12.0 - 15.0 g/dL    Comment: REPEATED TO VERIFY   HCT 20.4 (L) 36.0 - 46.0 %   MCV 85.7 80.0 -  100.0 fL   MCH 27.7 26.0 - 34.0 pg   MCHC 32.4 30.0 - 36.0 g/dL   RDW 97.2 82.0 - 60.1 %   Platelets 247 150 - 400 K/uL   nRBC 0.0 0.0 - 0.2 %    Comment: Performed at [facility], [address], [place], [place] [address]  Prepare RBC (crossmatch)     Status: None (Preliminary result)   Collection Time: 04/27/20 10:17 AM  Result Value Ref Range   Order Confirmation PENDING     Assessment:   30 y.o. G1P1001 postpartum day # 2, lactating  Plan:    1) Acute blood loss anemia - asymptomatic - recheck CBC and transfuse if continues to drop - po ferrous sulfate  2) Blood Type --/--/O POS Performed at [facility], [address] [place], [place] [address]  [phone]/02 1233) / Ishmael Holter 3.64 (08/09 0958) / Varicella Immune  3) TDAP status up to date  4) Feeding plan breast  5)  Education given regarding options for contraception, as well as compatibility with breast feeding if applicable.  Patient plans on depo vs IUD for contraception.  6) Disposition: pending results of CBC and transfusion if needed   Tresea Mall, CNM [facility] 04/27/2020, 10:36 AM

## 2020-04-27 NOTE — Anesthesia Postprocedure Evaluation (Signed)
Anesthesia Post Note  Patient: FELECITY LEMASTER  Procedure(s) Performed: AN AD HOC LABOR EPIDURAL  Anesthesia Type: Epidural Level of consciousness: awake and alert and oriented Pain management: pain level controlled Vital Signs Assessment: post-procedure vital signs reviewed and stable Respiratory status: spontaneous breathing and respiratory function stable Cardiovascular status: stable Postop Assessment: no headache, no backache, patient able to bend at knees, no apparent nausea or vomiting, able to ambulate and adequate PO intake Anesthetic complications: no   No complications documented.   Last Vitals:  Vitals:   04/26/20 1547 04/26/20 2214  BP: 130/81 107/61  Pulse: 94 (!) 102  Resp: 20 20  Temp: 36.9 C 36.7 C  SpO2: 100% 99%    Last Pain:  Vitals:   04/26/20 2214  TempSrc: Oral  PainSc:                  Zachary George

## 2020-04-27 NOTE — Progress Notes (Signed)
Ultrasound is unremarkable for retained products:  CLINICAL DATA:  Postpartum bleeding.  Question retained products.  EXAM: TRANSABDOMINAL ULTRASOUND OF PELVIS  TECHNIQUE: Transabdominal ultrasound examination of the pelvis was performed including evaluation of the uterus, ovaries, adnexal regions, and pelvic cul-de-sac.  COMPARISON:  Ultrasound 04/07/2020.  FINDINGS: Uterus  Measurements: 16.9 x 10.2 x 15.6 cm = volume: 1,407 cc mL. No fibroids or other mass visualized.  Endometrium  Thickness: 39.7 mm. Coarse echotexture. No intrauterine fluid collections and no definite retained products of conception. Follow-up exam can be obtained to demonstrate return to normalcy of endometrium following pregnancy.  Right ovary  Measurements: 5.8 x 2.8 x 3.2 cm = volume: 26.3 mL. Normal appearance/no adnexal mass.  Left ovary  Measurements: 4.3 x 2.3 x 3.4 cm = volume: 17.3 mL. Normal appearance/no adnexal mass.  Other findings:  No abnormal free fluid.  IMPRESSION: Endometrial thickening to 39.7 mm. No intrauterine fluid collections and no definite retained products of conception. Follow-up exam can be obtained to demonstrate return to normalcy of endometrium following pregnancy.   Electronically Signed   By: Maisie Fus  Register   On: 04/27/2020 10:09   Transfuse 2 units PRBC given dropping repeat CBC  Results for Michelle Massey, Michelle Massey "Michelle Massey" (MRN [id number]) as of 04/27/2020 12:00  Ref. Range 04/27/2020 03:40 04/27/2020 09:31 04/27/2020 10:17 04/27/2020 10:32  WBC Latest Ref Range: 4.0 - 10.5 K/uL 18.4 (H)   17.4 (H)  RBC Latest Ref Range: 3.87 - 5.11 MIL/uL 2.38 (L)   2.23 (L)  Hemoglobin Latest Ref Range: 12.0 - 15.0 g/dL 6.6 (L)   6.2 (L)  HCT Latest Ref Range: 36.0 - 46.0 % 20.4 (L)   19.3 (L)  MCV Latest Ref Range: 80.0 - 100.0 fL 85.7   86.5  MCH Latest Ref Range: 26.0 - 34.0 pg 27.7   27.8  MCHC Latest Ref Range: 30.0 - 36.0 g/dL 81.8   56.3  RDW Latest Ref  Range: 11.5 - 15.5 % 15.4   15.4  Platelets Latest Ref Range: 150 - 400 K/uL 247   247  nRBC Latest Ref Range: 0.0 - 0.2 % 0.0   0.0   Repeat CBC after transfusion   Parke Poisson, CNM [facility] 04/27/2020, 12:03 PM

## 2020-04-28 ENCOUNTER — Encounter: Payer: Medicaid Other | Admitting: Advanced Practice Midwife

## 2020-04-28 DIAGNOSIS — Z13220 Encounter for screening for lipoid disorders: Secondary | ICD-10-CM

## 2020-04-28 LAB — TYPE AND SCREEN
ABO/RH(D): O POS
Antibody Screen: NEGATIVE
Unit division: 0
Unit division: 0

## 2020-04-28 LAB — CBC
HCT: 25.1 % — ABNORMAL LOW (ref 36.0–46.0)
Hemoglobin: 8.4 g/dL — ABNORMAL LOW (ref 12.0–15.0)
MCH: 28.2 pg (ref 26.0–34.0)
MCHC: 33.5 g/dL (ref 30.0–36.0)
MCV: 84.2 fL (ref 80.0–100.0)
Platelets: 294 10*3/uL (ref 150–400)
RBC: 2.98 MIL/uL — ABNORMAL LOW (ref 3.87–5.11)
RDW: 15.8 % — ABNORMAL HIGH (ref 11.5–15.5)
WBC: 13.8 10*3/uL — ABNORMAL HIGH (ref 4.0–10.5)
nRBC: 0 % (ref 0.0–0.2)

## 2020-04-28 LAB — BPAM RBC
Blood Product Expiration Date: 202201082359
Blood Product Expiration Date: 202201082359
ISSUE DATE / TIME: 202201041321
ISSUE DATE / TIME: 202201041549
Unit Type and Rh: 5100
Unit Type and Rh: 5100

## 2020-04-28 MED ORDER — IBUPROFEN 600 MG PO TABS: 600.0000 mg | ORAL_TABLET | Freq: Four times a day (QID) | ORAL | 0 refills | Status: DC

## 2020-04-28 MED ORDER — FERROUS SULFATE 325 (65 FE) MG PO TABS: 325.0000 mg | ORAL_TABLET | ORAL | 3 refills | Status: DC

## 2020-04-28 NOTE — Discharge Planning (Signed)
Postpartum and Newborn discharge instructions and follow up reviewed. Pt verbalizes understanding. Pt discharged with infant via wc

## 2020-04-28 NOTE — Discharge Instructions (Signed)

## 2020-04-28 NOTE — Lactation Note (Signed)
This note was copied from a baby's chart. Lactation Consultation Note  Patient Name: Michelle Massey Date: 04/28/2020 Reason for consult: Follow-up assessment;1st time breastfeeding;Early term 37-38.6wks Age:32 hours  Lactation follow-up prior to anticipated discharge. [name] was circumcised yesterday and has recovered well. Mom was not feeling well and received blood transfusions yesterday, she was tired and did not feel well; feeling better today. Parents had questions about baby's behavior, pacifier use, and formula introduction when mom returns to work. LC encouraged continued on demand feedings, discussed options of supply expressed breastmilk to childcare provider, impact early formula may have on establishing a supply, and also reviewed how to breast and formula feed should she choose to do so. Explained limiting exposure to pacifier as this can interfere with learning early hunger cues and creating latching difficulties.  LC provided education on breast fullness and engorgement and management of both, nipple care, achieving a deep latch to prevent nipple damage and promote adequate transfer, 8 or more feedings/24 hours, output expectations, milk supply and demand and normal course of lactation.  Information for outpatient resources reviewed (previously given), as well as community breastfeeding resources. Parents are feeling well and ready to go home. Encouraged them to call out today with questions/concerns or ongoing BF assistance.  Maternal Data Formula Feeding for Exclusion: No Has patient been taught Hand Expression?: Yes Does the patient have breastfeeding experience prior to this delivery?: No  Feeding    LATCH Score                   Interventions Interventions: Breast feeding basics reviewed;Hand express;DEBP  Lactation Tools Discussed/Used     Consult Status Consult Status: Complete    Danford Bad 04/28/2020, 8:57 AM

## 2020-05-05 ENCOUNTER — Encounter: Payer: Medicaid Other | Admitting: Obstetrics and Gynecology

## 2020-05-06 ENCOUNTER — Emergency Department
Admission: EM | Admit: 2020-05-06 | Discharge: 2020-05-06 | Disposition: A | Discharge status: Home / Self Care | Payer: Medicaid Other | Attending: Emergency Medicine | Admitting: Emergency Medicine

## 2020-05-06 ENCOUNTER — Emergency Department: Payer: Medicaid Other

## 2020-05-06 ENCOUNTER — Other Ambulatory Visit: Payer: Self-pay

## 2020-05-06 DIAGNOSIS — Z87891 Personal history of nicotine dependence: Secondary | ICD-10-CM | POA: Insufficient documentation

## 2020-05-06 DIAGNOSIS — R102 Pelvic and perineal pain unspecified side: Secondary | ICD-10-CM

## 2020-05-06 DIAGNOSIS — O9089 Other complications of the puerperium, not elsewhere classified: Secondary | ICD-10-CM | POA: Insufficient documentation

## 2020-05-06 LAB — COMPREHENSIVE METABOLIC PANEL
ALT: 17 U/L (ref 0–44)
AST: 13 U/L — ABNORMAL LOW (ref 15–41)
Albumin: 3.2 g/dL — ABNORMAL LOW (ref 3.5–5.0)
Alkaline Phosphatase: 88 U/L (ref 38–126)
Anion gap: 12 (ref 5–15)
BUN: 13 mg/dL (ref 6–20)
CO2: 23 mmol/L (ref 22–32)
Calcium: 9.1 mg/dL (ref 8.9–10.3)
Chloride: 104 mmol/L (ref 98–111)
Creatinine, Ser: 0.76 mg/dL (ref 0.44–1.00)
GFR, Estimated: >60 mL/min (ref >60)
Glucose, Bld: 122 mg/dL — ABNORMAL HIGH (ref 70–99)
Potassium: 3.9 mmol/L (ref 3.5–5.1)
Sodium: 139 mmol/L (ref 135–145)
Total Bilirubin: 0.5 mg/dL (ref 0.3–1.2)
Total Protein: 7.1 g/dL (ref 6.5–8.1)

## 2020-05-06 LAB — CBC
HCT: 33.4 % — ABNORMAL LOW (ref 36.0–46.0)
Hemoglobin: 10.6 g/dL — ABNORMAL LOW (ref 12.0–15.0)
MCH: 27.5 pg (ref 26.0–34.0)
MCHC: 31.7 g/dL (ref 30.0–36.0)
MCV: 86.5 fL (ref 80.0–100.0)
Platelets: 767 10*3/uL — ABNORMAL HIGH (ref 150–400)
RBC: 3.86 MIL/uL — ABNORMAL LOW (ref 3.87–5.11)
RDW: 15.7 % — ABNORMAL HIGH (ref 11.5–15.5)
WBC: 10.2 10*3/uL (ref 4.0–10.5)
nRBC: 0 % (ref 0.0–0.2)

## 2020-05-06 LAB — HCG, QUANTITATIVE, PREGNANCY: hCG, Beta Chain, Quant, S: 21 m[IU]/mL — ABNORMAL HIGH (ref <5)

## 2020-05-06 LAB — LIPASE, BLOOD: Lipase: 25 U/L (ref 11–51)

## 2020-05-06 NOTE — ED Notes (Signed)
Assisted R. Rise Paganini with pelvic exam.

## 2020-05-06 NOTE — ED Provider Notes (Signed)
[facility] Emergency Department Provider Note  ____________________________________________   Event Date/Time   First MD Initiated Contact with Patient 05/06/20 1353     (approximate)  I have reviewed the triage vital signs and the nursing notes.   HISTORY  Chief Complaint Abdominal Pain   HPI Michelle Massey is a 30 y.o. female presents to the ED with complaint of lower abdominal/pelvic pain especially to left lower quadrant that just started recently.  Patient reports that she gave birth vaginally for the first time on 04/26/2020.  Patient reports minimal bleeding and denies fever or chills.  Patient states that there were no complications with the vaginal delivery but that her hemoglobin was low enough that she had to have transfusions.  She denies any nausea, vomiting or diarrhea.  She is unaware of any exposure to COVID.  She was discharged with instructions to take iron tablets daily.  Dr. Jean Rosenthal at [facility] was her doctor.  Currently she rates her pain as an 8 out of 10.     Past Medical History:  Diagnosis Date  . Collagen vascular disease (HCC)   . Lupus (HCC)   . Scoliosis     Patient Active Problem List   Diagnosis Date Noted  . Encounter for postpartum care after hospital delivery 04/28/2020  . Encounter for care and examination of lactating mother 04/28/2020  . Supervision of high risk pregnancy, antepartum 12/01/2019  . 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 RLQ abdominal pain 10/07/2019  . 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 NO PAST SURGERIES      Prior to Admission medications   Medication Sig Start Date End Date Taking? Authorizing Provider  ferrous sulfate 325 (65 FE) MG tablet Take 1 tablet (325 mg total) by mouth every other day. 04/28/20   Zipporah Plants, CNM  ibuprofen (ADVIL) 600 MG tablet Take 1 tablet (600 mg total) by mouth every 6 (six)  hours. 04/28/20   Zipporah Plants, CNM  Prenatal Vit-Fe Fumarate-FA (PRENATAL MULTIVITAMIN) TABS tablet Take 1 tablet by mouth daily at 12 noon.    [provider]    Allergies Patient has no known allergies.  Family History  Problem Relation Age of Onset  . Hodgkin's lymphoma Mother   . Hypertension Mother   . Cancer Mother   . Asthma Mother   . Bipolar disorder Sister   . Schizophrenia Sister   . Diabetes Maternal Great-grandfather   . Hypertension Maternal Great-grandfather   . Breast cancer Neg Hx   . Ovarian cancer Neg Hx   . Colon cancer Neg Hx     Social History Social History   Tobacco Use  . Smoking status: Former Smoker    Packs/day: 0.50    Types: Cigarettes    Start date: 2013    Quit date: 01/22/2018    Years since quitting: 2.2  . Smokeless tobacco: Never Used  Vaping Use  . Vaping Use: Never used  Substance Use Topics  . Alcohol use: Not Currently  . Drug use: Yes    Types: Marijuana    Comment: Quit at pregnancy-June 2021    Review of Systems Constitutional: No fever/chills Eyes: No visual changes. ENT: No sore throat. Cardiovascular: Denies chest pain. Respiratory: Denies shortness of breath. Gastrointestinal: Positive abdominal pain/pelvic pain.  No nausea, no vomiting.  No diarrhea.  No constipation. Genitourinary: Negative for dysuria.  Positive vaginal bleeding.  Positive recent vaginal  delivery. Musculoskeletal: Negative for back pain. Skin: Negative for rash. Neurological: Negative for headaches, focal weakness or numbness. ____________________________________________   PHYSICAL EXAM:  VITAL SIGNS: ED Triage Vitals  Enc Vitals Group     BP 05/06/20 1246 125/84     Pulse Rate 05/06/20 1246 96     Resp 05/06/20 1246 18     Temp 05/06/20 1245 98.5 F (36.9 C)     Temp Source 05/06/20 1245 Oral     SpO2 05/06/20 1246 97 %     Weight 05/06/20 1249 166 lb (75.3 kg)     Height 05/06/20 1249 5\' 6"  (1.676 m)     Head  Circumference --      Peak Flow --      Pain Score 05/06/20 1249 8     Pain Loc --      Pain Edu? --      Excl. in GC? --     Constitutional: Alert and oriented. Well appearing and in no acute distress. Eyes: Conjunctivae are normal.  Head: Atraumatic. Neck: No stridor.   Cardiovascular: Normal rate, regular rhythm. Grossly normal heart sounds.  Good peripheral circulation. Respiratory: Normal respiratory effort.  No retractions. Lungs CTAB. Gastrointestinal: Soft and nontender. No distention.  Bowel sounds normoactive x4 quadrants.  There is some generalized bilateral lower quadrant discomfort. Genitourinary: On pelvic exam there is no active vaginal bleeding noted.  No cervical motion tenderness on bimanual exam.  Patient is nontender right adnexal area but has tenderness left adnexal area without masses noted. Musculoskeletal: Moves upper and lower extremities without any difficulty.  Normal gait was noted. Neurologic:  Normal speech and language. No gross focal neurologic deficits are appreciated. No gait instability. Skin:  Skin is warm, dry and intact.  Psychiatric: Mood and affect are normal. Speech and behavior are normal.  ____________________________________________   LABS (all labs ordered are listed, but only abnormal results are displayed)  Labs Reviewed  COMPREHENSIVE METABOLIC PANEL - Abnormal; Notable for the following components:      Result Value   Glucose, Bld 122 (*)    Albumin 3.2 (*)    AST 13 (*)    All other components within normal limits  CBC - Abnormal; Notable for the following components:   RBC 3.86 (*)    Hemoglobin 10.6 (*)    HCT 33.4 (*)    RDW 15.7 (*)    Platelets 767 (*)    All other components within normal limits  HCG, QUANTITATIVE, PREGNANCY - Abnormal; Notable for the following components:   hCG, Beta Chain, Quant, S 21 (*)    All other components within normal limits  LIPASE, BLOOD  URINALYSIS, COMPLETE (UACMP) WITH MICROSCOPIC    ____________________________________________ __________________________________________  RADIOLOGY 05/08/20, personally viewed and evaluated these images (plain radiographs) as part of my medical decision making, as well as reviewing the written report by the radiologist.   Official radiology report(s): Beaulah Corin Pelvis Complete  Result Date: 05/06/2020 CLINICAL DATA:  Abdominal pain, diffuse lower abdominal pain in a 30 year old female post vaginal delivery on 04/26/2020. EXAM: TRANSABDOMINAL ULTRASOUND OF PELVIS TECHNIQUE: Transabdominal ultrasound examination of the pelvis was performed including evaluation of the uterus, ovaries, adnexal regions, and pelvic cul-de-sac. COMPARISON:  Pelvic sonogram of April 27, 2020 FINDINGS: Uterus Measurements: 10.5 x 7.6 x 10.2 cm = volume: 429 mL. Globular appearance of the uterus which is anteverted. Endometrium Thickness: 9 mm. Some fluid in the endometrial canal. Distinct endometrial boundaries are difficult to determine.  Thickness may be slightly less than outlined above. Echogenic areas within the endometrial canal mainly towards the fundus in the anterior portion, anti-dependent aspect. Right ovary Measurements: 3.9 x 1.9 x 3.1 cm = volume: 11.9 mL. Normal appearance/no adnexal mass. Left ovary Measurements: 3.3 x 2.1 x 2.4 cm = volume: 8.8 mL. Normal appearance/no adnexal mass. Other findings:  No free fluid in the pelvis. IMPRESSION: 1. Diminished thickness of the endometrium which is somewhat indistinct on the current study estimated to be near 9 mm. 2. More globular appearance of the uterus with gas in the endometrial canal, correlate with any clinical signs of endometritis or recent instrumentation and or D and C. These results were called by telephone at the time of interpretation on 05/06/2020 at 2:07 pm to provider [facility] , who verbally acknowledged these results. Electronically Signed   By: Donzetta Kohut M.D.   On: 05/06/2020 14:03     ____________________________________________   PROCEDURES  Procedure(s) performed (including Critical Care):  Procedures   ____________________________________________   INITIAL IMPRESSION / ASSESSMENT AND PLAN / ED COURSE  As part of my medical decision making, I reviewed the following data within the electronic MEDICAL RECORD NUMBER Notes from prior ED visits and [place] Controlled Substance Database  30 year old female presents to the ED with complaint of vaginal/lower abdominal pain after a vaginal delivery 1-1/2 weeks ago.  Patient reports some vaginal bleeding which has decreased in amount.  She denies any fever chills.  There has been no nausea, vomiting or diarrhea.  Ultrasound was performed prior to exam.  Radiology reading questioned possible endometritis.  Lab work is reassuring and hemoglobin was increased to 10.6 over her discharge hemoglobin.  Dr. Broadus John was on-call for [facility] who reviewed patient's chart.  In the absence of fever, chills and elevated WBC it is less likely that patient has endometritis.  Arrangements were made for patient to be seen in the office tomorrow with Dr. Jean Rosenthal on 05/07/2020 at 2:30 PM.  Patient was called from the office while in the ED and agrees to comply with this appointment.  She is encouraged to return to the emergency department if any severe worsening of her symptoms or urgent concerns during the night.  ____________________________________________   FINAL CLINICAL IMPRESSION(S) / ED DIAGNOSES  Final diagnoses:  Acute pelvic pain     ED Discharge Orders    None      *Please note:  JAHNIYAH REVERE was evaluated in Emergency Department on 05/06/2020 for the symptoms described in the history of present illness. She was evaluated in the context of the global COVID-19 pandemic, which necessitated consideration that the patient might be at risk for infection with the SARS-CoV-2 virus that causes COVID-19. Institutional protocols and  algorithms that pertain to the evaluation of patients at risk for COVID-19 are in a state of rapid change based on information released by regulatory bodies including the CDC and federal and state organizations. These policies and algorithms were followed during the patient's care in the ED.  Some ED evaluations and interventions may be delayed as a result of limited staffing during and the pandemic.*   Note:  This document was prepared using Dragon voice recognition software and may include unintentional dictation errors.    Tommi Rumps, PA-C 05/06/20 Garnette Scheuermann    Shaune Pollack, MD 05/06/20 2025

## 2020-05-06 NOTE — Discharge Instructions (Addendum)
Call Dr. Edison Pace office tomorrow to see what time he wants to see you tomorrow for recheck. Return to the emergency department if any severe worsening of your symptoms.  Continue taking the ferrous sulfate tablets for your anemia.  Also consider getting a stool softener such as Colace to help prevent constipation.

## 2020-05-06 NOTE — ED Notes (Signed)
Pt states delivering a child on 04/26/20 and is still having abdominal pain to the LLQ. Pt states pain on palpation to the LLQ. Pt states minimal bleeding. Pt denies complications with delivery but stated she had to have 2 units of blood during the pregnancy.

## 2020-05-06 NOTE — ED Triage Notes (Signed)
Pt to ED POV for chief complaint of generalized abdominal pain and vaginal pain after giving birth 1.5 weeks ago. Pt reports vaginal bleeding has subsided.  Had complications and had to have blood transfusion.  Pt in NAD Discussed pt with Dr Larinda Buttery

## 2020-05-07 ENCOUNTER — Encounter: Payer: Self-pay | Admitting: Obstetrics and Gynecology

## 2020-05-07 ENCOUNTER — Ambulatory Visit (INDEPENDENT_AMBULATORY_CARE_PROVIDER_SITE_OTHER): Payer: Medicaid Other | Admitting: Obstetrics and Gynecology

## 2020-05-07 VITALS — BP 118/74 | Wt 161.0 lb

## 2020-05-07 DIAGNOSIS — R102 Pelvic and perineal pain: Secondary | ICD-10-CM

## 2020-05-07 NOTE — Progress Notes (Signed)
Obstetrics & Gynecology Office Visit    Chief Complaint  Patient presents with  . Follow-up  ER follow up for abdominal pain  History of Present Illness: 30 y.o. G1P1001 who is 11 days postpartum from an SVD complicated by acute blood loss anemia due to a delayed postpartum hemorrhage and is status post blood transfusion. SHe presented to the ER yesterday for abdominal pain. No particular etiology was found. There was a question of endometritis on ultrasound. However, she had a normal WBC count and her exam did not suggest an infection.  She was discharged with close follow up.  She presents today saying she has a hard time sitting in certain positions and pain with walking.  She denies fevers, chills, nausea, emesis, diarrhea, constipation.  She has normal lochia. She is breastfeeding. She has a hard time pointing out the exact location of her pain. It is generally in her inguinal area and in her lower pelvic/pelvic floor area.     Past Medical History:  Diagnosis Date  . Collagen vascular disease (HCC)   . Lupus (HCC)   . Scoliosis     Past Surgical History:  Procedure Laterality Date  . NO PAST SURGERIES      Gynecologic History: No LMP recorded.  Obstetric History: G1P1001  Family History  Problem Relation Age of Onset  . Hodgkin's lymphoma Mother   . Hypertension Mother   . Cancer Mother   . Asthma Mother   . Bipolar disorder Sister   . Schizophrenia Sister   . Diabetes Maternal Great-grandfather   . Hypertension Maternal Great-grandfather   . Breast cancer Neg Hx   . Ovarian cancer Neg Hx   . Colon cancer Neg Hx     Social History   Socioeconomic History  . Marital status: Single    Spouse name: Not on file  . Number of children: 0  . Years of education: 10-11  . Highest education level: Not on file  Occupational History  . Not on file  Tobacco Use  . Smoking status: Former Smoker    Packs/day: 0.50    Types: Cigarettes    Start date: 2013    Quit  date: 01/22/2018    Years since quitting: 2.2  . Smokeless tobacco: Never Used  Vaping Use  . Vaping Use: Never used  Substance and Sexual Activity  . Alcohol use: Not Currently  . Drug use: Yes    Types: Marijuana    Comment: Quit at pregnancy-June 2021  . Sexual activity: Yes    Birth control/protection: None  Other Topics Concern  . Not on file  Social History Narrative  . Not on file   Social Determinants of Health   Financial Resource Strain: Not on file  Food Insecurity: Not on file  Transportation Needs: Not on file  Physical Activity: Not on file  Stress: Not on file  Social Connections: Not on file  Intimate Partner Violence: Not on file    No Known Allergies  Prior to Admission medications   Medication Sig Start Date End Date Taking? Authorizing Provider  ferrous sulfate 325 (65 FE) MG tablet Take 1 tablet (325 mg total) by mouth every other day. 04/28/20   Zipporah Plants, CNM  ibuprofen (ADVIL) 600 MG tablet Take 1 tablet (600 mg total) by mouth every 6 (six) hours. 04/28/20   Zipporah Plants, CNM  Prenatal Vit-Fe Fumarate-FA (PRENATAL MULTIVITAMIN) TABS tablet Take 1 tablet by mouth daily at 12 noon.    [provider]  Review of Systems  Constitutional: Negative.  Negative for chills, fever, malaise/fatigue and weight loss.  HENT: Negative.   Eyes: Negative.   Respiratory: Negative.   Cardiovascular: Negative.   Gastrointestinal: Negative.  Negative for abdominal pain, blood in stool, constipation, diarrhea, melena, nausea and vomiting.  Genitourinary: Negative.  Negative for frequency and urgency.  Musculoskeletal: Positive for joint pain and myalgias. Negative for back pain, falls and neck pain.  Skin: Negative.   Neurological: Negative.   Psychiatric/Behavioral: Negative.      Physical Exam BP 118/74   Wt 161 lb (73 kg)   BMI 25.99 kg/m  No LMP recorded. Physical Exam Constitutional:      General: She is not in acute distress.     Appearance: Normal appearance.  HENT:     Head: Normocephalic and atraumatic.  Eyes:     General: No scleral icterus.    Conjunctiva/sclera: Conjunctivae normal.  Musculoskeletal:     Right hip: Tenderness present.     Left hip: No tenderness.       Legs:  Neurological:     General: No focal deficit present.     Mental Status: She is alert and oriented to person, place, and time.     Cranial Nerves: No cranial nerve deficit.  Psychiatric:        Mood and Affect: Mood normal.        Behavior: Behavior normal.        Judgment: Judgment normal.     Female chaperone present for pelvic and breast  portions of the physical exam  Assessment: 30 y.o. G53P1001 female here for  1. Pain of pelvic girdle      Plan: Problem List Items Addressed This Visit   None   Visit Diagnoses    Pain of pelvic girdle    -  Primary     Conservative measures for now. Referral to PT, if worsens. No acute issues identified today.   Thomasene Mohair, MD 05/07/2020 3:03 PM

## 2020-06-11 ENCOUNTER — Ambulatory Visit (INDEPENDENT_AMBULATORY_CARE_PROVIDER_SITE_OTHER): Payer: Medicaid Other | Admitting: Obstetrics and Gynecology

## 2020-06-11 ENCOUNTER — Other Ambulatory Visit (HOSPITAL_COMMUNITY)
Admission: RE | Admit: 2020-06-11 | Discharge: 2020-06-11 | Disposition: A | Discharge status: Home / Self Care | Payer: Medicaid Other | Source: Ambulatory Visit | Attending: Obstetrics and Gynecology | Admitting: Obstetrics and Gynecology

## 2020-06-11 ENCOUNTER — Other Ambulatory Visit: Payer: Self-pay

## 2020-06-11 ENCOUNTER — Encounter: Payer: Self-pay | Admitting: Obstetrics and Gynecology

## 2020-06-11 DIAGNOSIS — R87610 Atypical squamous cells of undetermined significance on cytologic smear of cervix (ASC-US): Secondary | ICD-10-CM | POA: Diagnosis present

## 2020-06-11 DIAGNOSIS — R87810 Cervical high risk human papillomavirus (HPV) DNA test positive: Secondary | ICD-10-CM | POA: Diagnosis present

## 2020-06-11 NOTE — Progress Notes (Signed)
Postpartum Visit  Chief Complaint:  Chief Complaint  Patient presents with  . Postpartum Care    History of Present Illness: Patient is a 30 y.o. G1P1001 presents for postpartum visit.  Date of delivery: 04/26/2020 Type of delivery: Vaginal delivery - Vacuum or forceps assisted  no Episiotomy No.  Laceration: 2nd degree Pregnancy or labor problems:  Delayed postpartum hemorrhage, s/p blood transfusion Any problems since the delivery:  Pelvic pain. Went to ER  Newborn Details:  SINGLETON :  1. Baby's name: Chalres. Birth weight: 7.13lb Maternal Details:  Breast Feeding:  BOTTLE Post partum depression/anxiety noted:  no [place] Post-Partum Depression Score:  0  Date of last PAP: 12/01/2019-HPV+ ASCUS  Past Medical History:  Diagnosis Date  . Collagen vascular disease (HCC)   . Lupus (HCC)   . Scoliosis     Past Surgical History:  Procedure Laterality Date  . NO PAST SURGERIES      Prior to Admission medications   Medication Sig Start Date End Date Taking? Authorizing Provider  ferrous sulfate 325 (65 FE) MG tablet Take 1 tablet (325 mg total) by mouth every other day. 04/28/20   Zipporah Plants, CNM  ibuprofen (ADVIL) 600 MG tablet Take 1 tablet (600 mg total) by mouth every 6 (six) hours. 04/28/20   Zipporah Plants, CNM  Prenatal Vit-Fe Fumarate-FA (PRENATAL MULTIVITAMIN) TABS tablet Take 1 tablet by mouth daily at 12 noon.    [provider]    No Known Allergies   Social History   Socioeconomic History  . Marital status: Single    Spouse name: Not on file  . Number of children: 0  . Years of education: 10-11  . Highest education level: Not on file  Occupational History  . Not on file  Tobacco Use  . Smoking status: Former Smoker    Packs/day: 0.50    Types: Cigarettes    Start date: 2013    Quit date: 01/22/2018    Years since quitting: 2.3  . Smokeless tobacco: Never Used  Vaping Use  . Vaping Use: Never used  Substance and Sexual Activity  .  Alcohol use: Not Currently  . Drug use: Yes    Types: Marijuana    Comment: Quit at pregnancy-June 2021  . Sexual activity: Yes    Birth control/protection: None  Other Topics Concern  . Not on file  Social History Narrative  . Not on file   Social Determinants of Health   Financial Resource Strain: Not on file  Food Insecurity: Not on file  Transportation Needs: Not on file  Physical Activity: Not on file  Stress: Not on file  Social Connections: Not on file  Intimate Partner Violence: Not on file    Family History  Problem Relation Age of Onset  . Hodgkin's lymphoma Mother   . Hypertension Mother   . Cancer Mother   . Asthma Mother   . Bipolar disorder Sister   . Schizophrenia Sister   . Diabetes Maternal Great-grandfather   . Hypertension Maternal Great-grandfather   . Breast cancer Neg Hx   . Ovarian cancer Neg Hx   . Colon cancer Neg Hx     Review of Systems  Constitutional: Negative.   HENT: Negative.   Eyes: Negative.   Respiratory: Negative.   Cardiovascular: Negative.   Gastrointestinal: Negative.   Genitourinary: Negative.   Musculoskeletal: Negative.   Skin: Negative.   Neurological: Negative.   Psychiatric/Behavioral: Negative.      Physical Exam BP 122/74  Ht 5\' 6"  (1.676 m)   Wt 154 lb (69.9 kg)   BMI 24.86 kg/m   Physical Exam Constitutional:      General: She is not in acute distress.    Appearance: Normal appearance. She is well-developed.  Genitourinary:     Vulva, bladder and urethral meatus normal.     Right Labia: No rash, tenderness, lesions, skin changes or Bartholin's cyst.    Left Labia: No tenderness, skin changes, Bartholin's cyst or rash.    No inguinal adenopathy present in the right or left side.    Pelvic Tanner Score: 5/5.     Right Adnexa: not tender, not full and no mass present.    Left Adnexa: not tender, not full and no mass present.    No cervical motion tenderness, friability, lesion or polyp.     Uterus is  not enlarged, fixed or tender.     Uterus is anteverted.     No urethral tenderness or mass present.     Pelvic exam was performed with patient in the lithotomy position.  HENT:     Head: Normocephalic and atraumatic.  Eyes:     General: No scleral icterus.    Conjunctiva/sclera: Conjunctivae normal.  Cardiovascular:     Rate and Rhythm: Normal rate and regular rhythm.     Heart sounds: No murmur heard. No friction rub. No gallop.   Pulmonary:     Effort: Pulmonary effort is normal. No respiratory distress.     Breath sounds: Normal breath sounds. No wheezing or rales.  Abdominal:     General: Bowel sounds are normal. There is no distension.     Palpations: Abdomen is soft. There is no mass.     Tenderness: There is no abdominal tenderness. There is no guarding or rebound.     Hernia: There is no hernia in the left inguinal area or right inguinal area.  Musculoskeletal:        General: Normal range of motion.     Cervical back: Normal range of motion and neck supple.  Lymphadenopathy:     Lower Body: No right inguinal adenopathy. No left inguinal adenopathy.  Neurological:     General: No focal deficit present.     Mental Status: She is alert and oriented to person, place, and time.     Cranial Nerves: No cranial nerve deficit.  Skin:    General: Skin is warm and dry.     Findings: No erythema.  Psychiatric:        Mood and Affect: Mood normal.        Behavior: Behavior normal.        Judgment: Judgment normal.      Female Chaperone present during breast and/or pelvic exam.  Assessment: 30 y.o. G1P1001 presenting for 6 week postpartum visit  Plan: Problem List Items Addressed This Visit   None   Visit Diagnoses    Postpartum care and examination    -  Primary   Relevant Orders   Cytology - PAP   ASCUS with positive high risk HPV cervical       Relevant Orders   Cytology - PAP       1) Contraception Education given: She is still undecided between Depo Provera  and an IUD. She will consider and let 36 know.  Contraception options such as condoms emphasized in the mean time.  2)  Pap - ASCCP guidelines and rational discussed.  Repeat pap smear today. If abnormal, then colposcopy.  3) Patient underwent screening for postpartum depression with no concerns noted.  Return in about 1 year (around 06/11/2021) for Annual Gynecologic Examination.   Thomasene Mohair, MD 06/11/2020 11:17 AM

## 2020-06-16 LAB — CYTOLOGY - PAP
Comment: NEGATIVE
Diagnosis: UNDETERMINED — AB
High risk HPV: POSITIVE — AB

## 2020-06-30 ENCOUNTER — Telehealth: Payer: Self-pay

## 2020-06-30 NOTE — Telephone Encounter (Signed)
Please let me know when pt calls me back about scheduling colpo/pap smear results.

## 2021-04-06 IMAGING — US US PELVIS COMPLETE
1 series · 13 of 25 positions shown · non-contrast
Comparison: Pelvic sonogram April 27, 2020

CLINICAL DATA: Abdominal pain, diffuse lower abdominal pain in a
29-year-old female post vaginal delivery on 04/26/2020.

EXAM:
TRANSABDOMINAL ULTRASOUND OF PELVIS
TECHNIQUE: Transabdominal ultrasound examination of the pelvis was performed
including evaluation of the uterus, ovaries, adnexal regions, and
pelvic cul-de-sac.

[Series 1: us pelvis (transabdominal only) · 90 acquisitions, 13 frames shown]
[im 1/90]
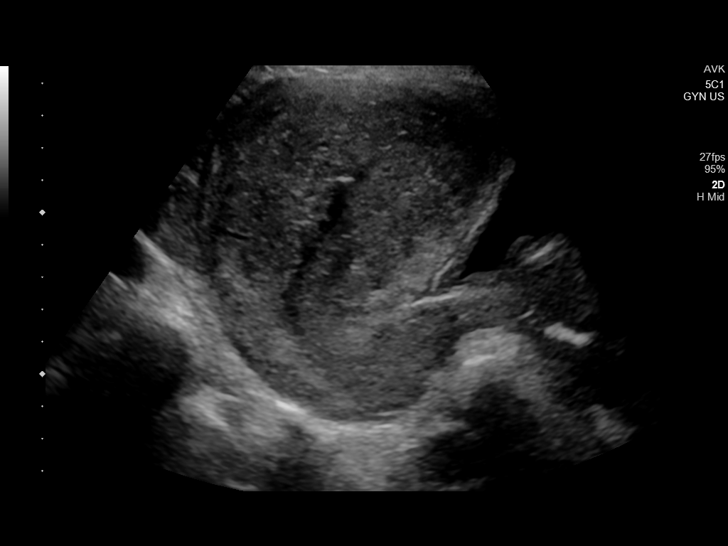
[im 8/90]
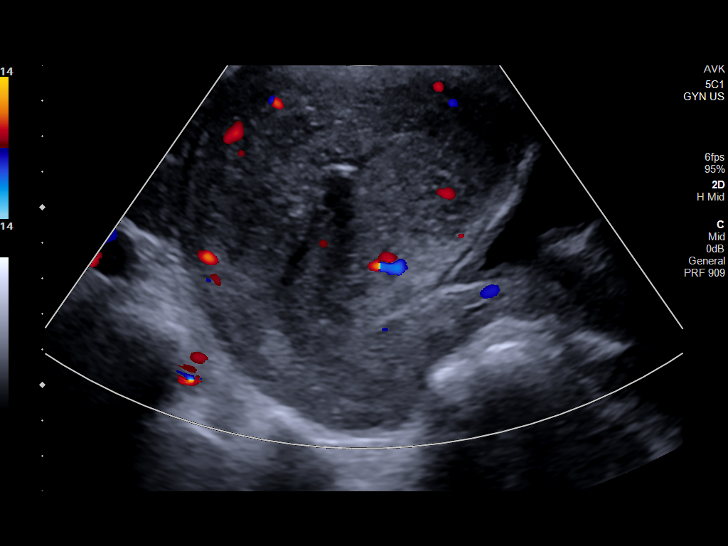
[im 15/90]
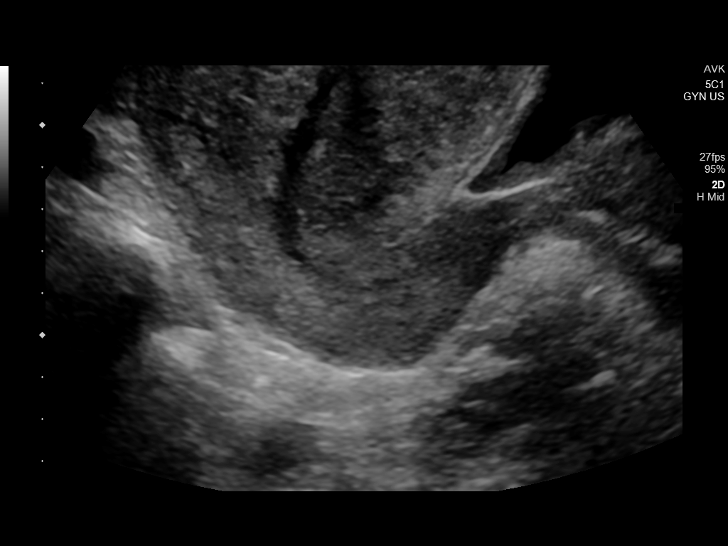
[im 23/90]
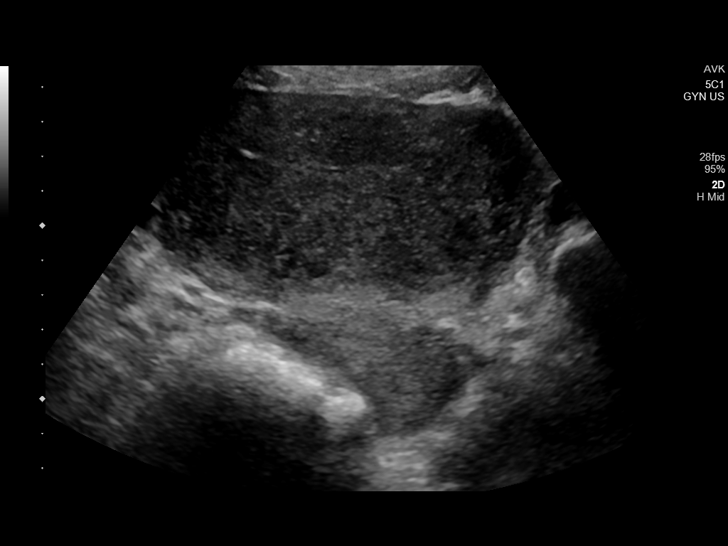
[im 30/90]
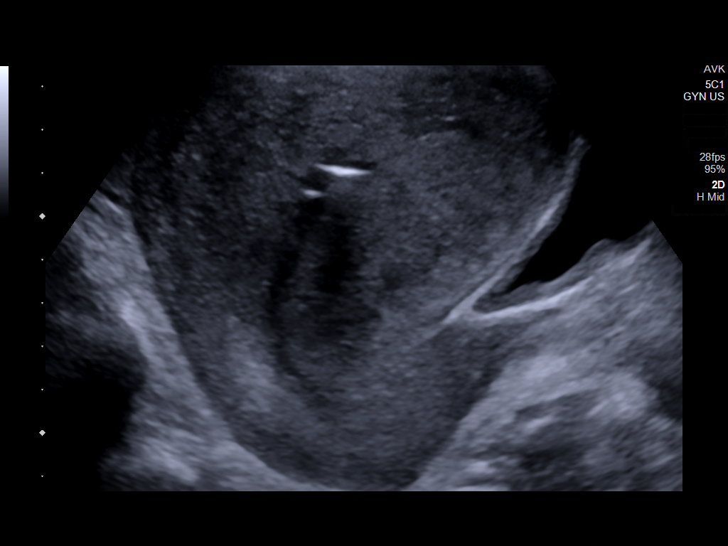
[im 38/90]
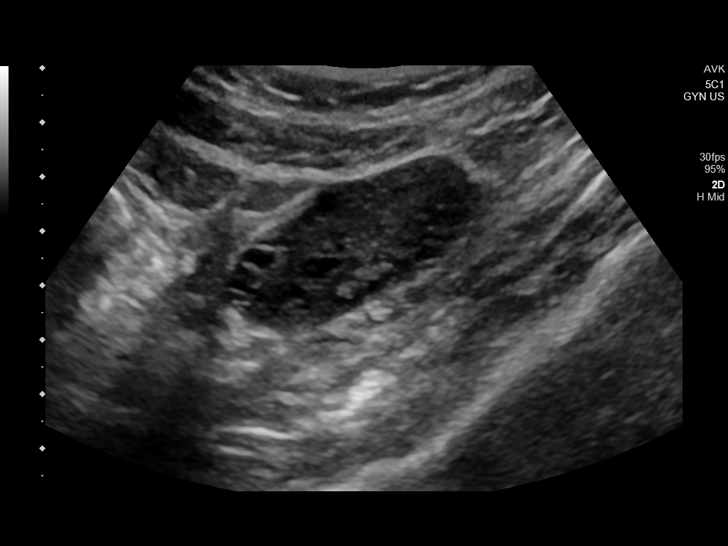
[im 45/90]
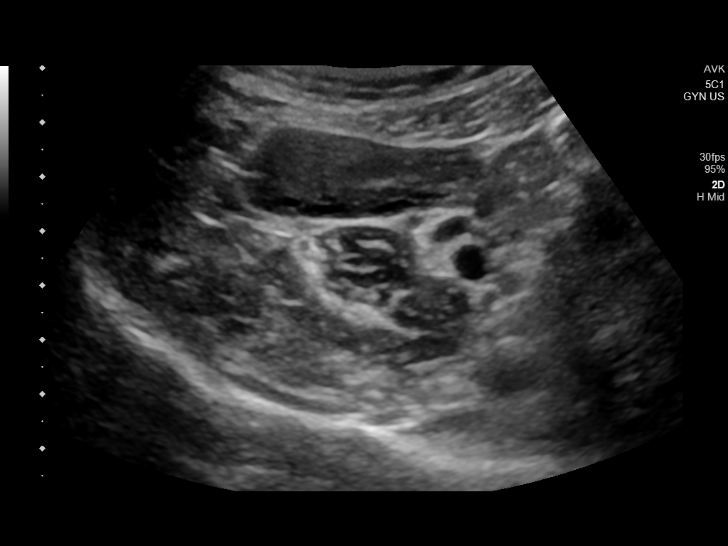
[im 52/90]
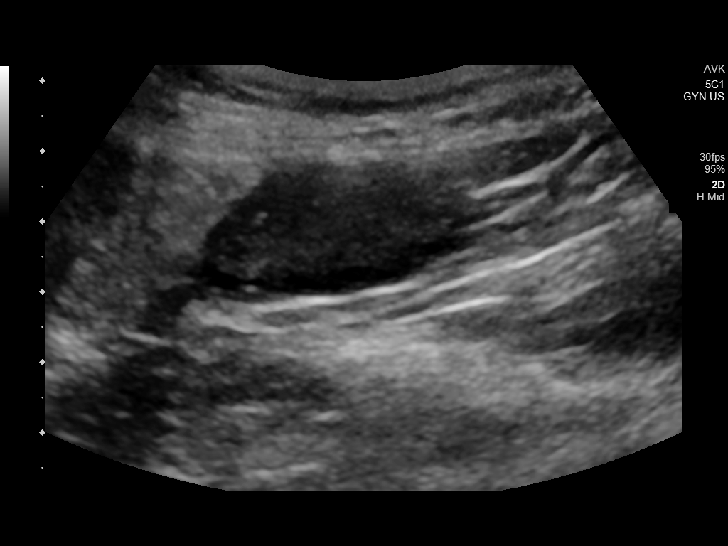
[im 60/90]
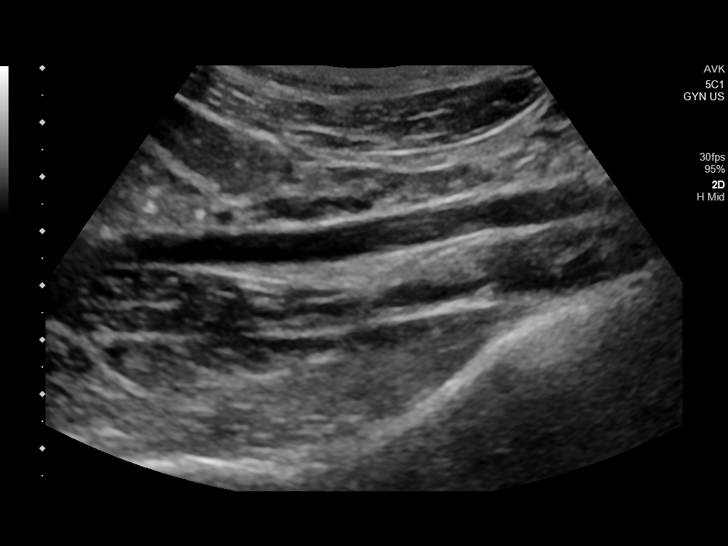
[im 67/90]
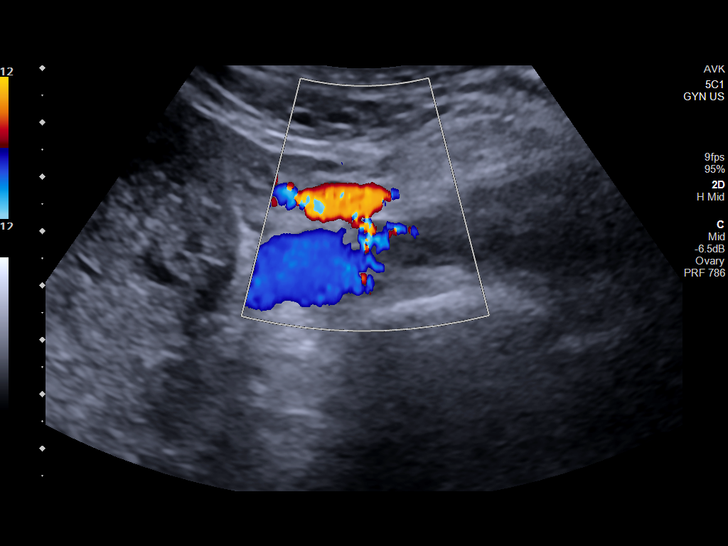
[im 75/90]
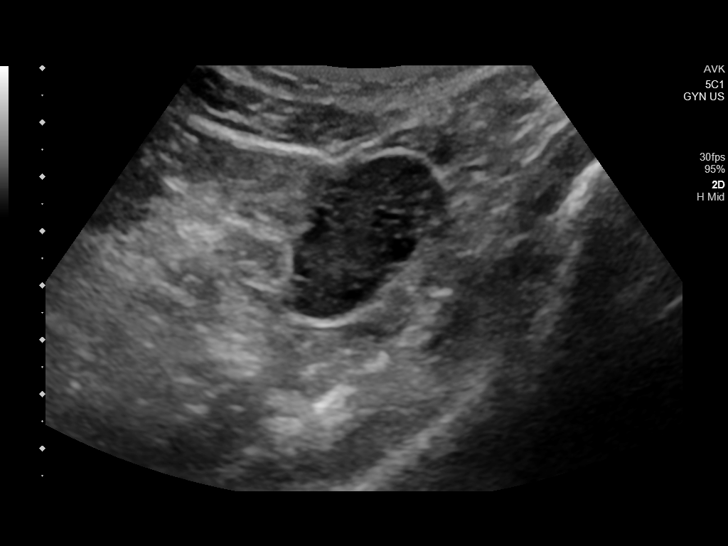
[im 82/90]
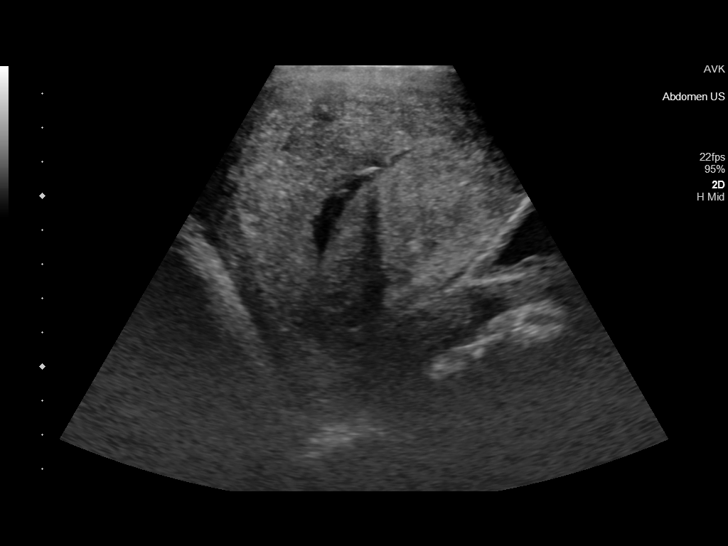
[im 90/90]
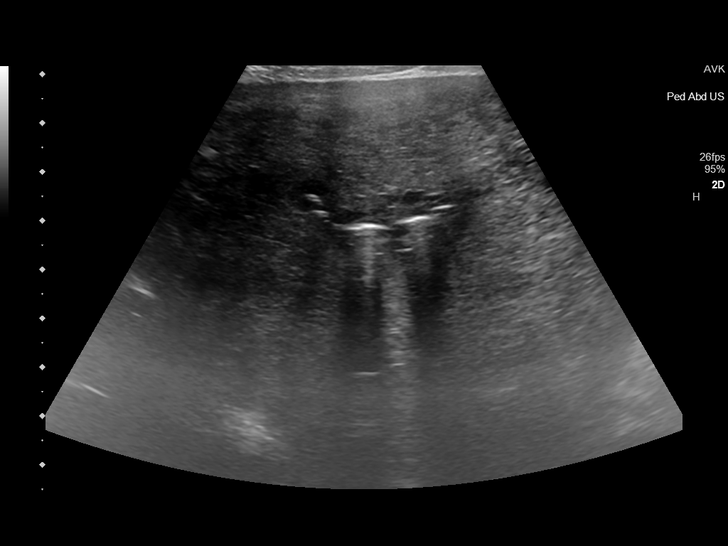

[13 of 25 positions shown; findings below may reference images not displayed]

FINDINGS: Uterus

Measurements: 10.5 x 7.6 x 10.2 cm = volume: 429 mL. Globular
appearance of the uterus which is anteverted.

Endometrium

Thickness: 9 mm. Some fluid in the endometrial canal. Distinct
endometrial boundaries are difficult to determine. Thickness may be
slightly less than outlined above. Echogenic areas within the
endometrial canal mainly towards the fundus in the anterior portion,
anti-dependent aspect.

Right ovary

Measurements: 3.9 x 1.9 x 3.1 cm = volume: 11.9 mL. Normal
appearance/no adnexal mass.

Left ovary

Measurements: 3.3 x 2.1 x 2.4 cm = volume: 8.8 mL. Normal
appearance/no adnexal mass.

Other findings:  No free fluid in the pelvis.
IMPRESSION: 1. Diminished thickness of the endometrium which is somewhat
indistinct on the current study estimated to be near 9 mm.
2. More globular appearance of the uterus with gas in the
endometrial canal, correlate with any clinical signs of endometritis
or recent instrumentation and or D and C.

These results were called by telephone at the time of interpretation
on 05/06/2020 at [DATE] to provider BLADE AUJLA , who verbally
acknowledged these results.

## 2022-04-21 ENCOUNTER — Other Ambulatory Visit: Payer: Self-pay

## 2022-04-21 ENCOUNTER — Emergency Department
Admission: EM | Admit: 2022-04-21 | Discharge: 2022-04-21 | Disposition: A | Discharge status: Home / Self Care | Payer: Medicaid Other | Attending: Emergency Medicine | Admitting: Emergency Medicine

## 2022-04-21 ENCOUNTER — Encounter: Payer: Self-pay | Admitting: Emergency Medicine

## 2022-04-21 DIAGNOSIS — Z1152 Encounter for screening for COVID-19: Secondary | ICD-10-CM | POA: Diagnosis not present

## 2022-04-21 DIAGNOSIS — K529 Noninfective gastroenteritis and colitis, unspecified: Secondary | ICD-10-CM | POA: Diagnosis not present

## 2022-04-21 DIAGNOSIS — R112 Nausea with vomiting, unspecified: Secondary | ICD-10-CM | POA: Diagnosis present

## 2022-04-21 LAB — RESP PANEL BY RT-PCR (RSV, FLU A&B, COVID)  RVPGX2
Influenza A by PCR: NEGATIVE
Influenza B by PCR: NEGATIVE
Resp Syncytial Virus by PCR: NEGATIVE
SARS Coronavirus 2 by RT PCR: NEGATIVE

## 2022-04-21 LAB — CBC WITH DIFFERENTIAL/PLATELET
Abs Immature Granulocytes: 0.05 10*3/uL (ref 0.00–0.07)
Basophils Absolute: 0 10*3/uL (ref 0.0–0.1)
Basophils Relative: 0 %
Eosinophils Absolute: 0 10*3/uL (ref 0.0–0.5)
Eosinophils Relative: 0 %
HCT: 43.7 % (ref 36.0–46.0)
Hemoglobin: 14.4 g/dL (ref 12.0–15.0)
Immature Granulocytes: 0 %
Lymphocytes Relative: 6 %
Lymphs Abs: 0.8 10*3/uL (ref 0.7–4.0)
MCH: 29.3 pg (ref 26.0–34.0)
MCHC: 33 g/dL (ref 30.0–36.0)
MCV: 89 fL (ref 80.0–100.0)
Monocytes Absolute: 0.5 10*3/uL (ref 0.1–1.0)
Monocytes Relative: 4 %
Neutro Abs: 12.4 10*3/uL — ABNORMAL HIGH (ref 1.7–7.7)
Neutrophils Relative %: 90 %
Platelets: 259 10*3/uL (ref 150–400)
RBC: 4.91 MIL/uL (ref 3.87–5.11)
RDW: 12.9 % (ref 11.5–15.5)
WBC: 13.8 10*3/uL — ABNORMAL HIGH (ref 4.0–10.5)
nRBC: 0 % (ref 0.0–0.2)

## 2022-04-21 LAB — COMPREHENSIVE METABOLIC PANEL
ALT: 25 U/L (ref 0–44)
AST: 28 U/L (ref 15–41)
Albumin: 4.5 g/dL (ref 3.5–5.0)
Alkaline Phosphatase: 46 U/L (ref 38–126)
Anion gap: 9 (ref 5–15)
BUN: 13 mg/dL (ref 6–20)
CO2: 23 mmol/L (ref 22–32)
Calcium: 9.5 mg/dL (ref 8.9–10.3)
Chloride: 108 mmol/L (ref 98–111)
Creatinine, Ser: 0.74 mg/dL (ref 0.44–1.00)
GFR, Estimated: >60 mL/min (ref >60)
Glucose, Bld: 130 mg/dL — ABNORMAL HIGH (ref 70–99)
Potassium: 3.9 mmol/L (ref 3.5–5.1)
Sodium: 140 mmol/L (ref 135–145)
Total Bilirubin: 0.9 mg/dL (ref 0.3–1.2)
Total Protein: 8.2 g/dL — ABNORMAL HIGH (ref 6.5–8.1)

## 2022-04-21 LAB — LIPASE, BLOOD: Lipase: 28 U/L (ref 11–51)

## 2022-04-21 LAB — POC URINE PREG, ED: Preg Test, Ur: NEGATIVE

## 2022-04-21 MED ORDER — ONDANSETRON 4 MG PO TBDP: 4.0000 mg | ORAL_TABLET | Freq: Three times a day (TID) | ORAL | 0 refills | Status: DC | PRN

## 2022-04-21 NOTE — ED Triage Notes (Signed)
Patient arrives ambulatory by POV c/o N/V/D onset of last night. Tried sprite and gingerale with no relief.

## 2022-04-21 NOTE — ED Provider Triage Note (Signed)
Emergency Medicine Provider Triage Evaluation Note  Michelle Massey , a 31 y.o. female  was evaluated in triage.  Pt complains of n/v/d since last night. No sick contacts. Non-bloody. No fevers. Has been drinking sprite and gingerale. No abdominal pain.  Review of Systems  Positive: N/v/d Negative: Abd pain, fever  Physical Exam  There were no vitals taken for this visit. Gen:   Awake, no distress   Resp:  Normal effort  MSK:   Moves extremities without difficulty  Other:    Medical Decision Making  Medically screening exam initiated at 1:45 PM.  Appropriate orders placed.  Michelle Massey was informed that the remainder of the evaluation will be completed by another provider, this initial triage assessment does not replace that evaluation, and the importance of remaining in the ED until their evaluation is complete.     Jackelyn Hoehn, PA-C 04/21/22 1347

## 2022-04-21 NOTE — ED Provider Notes (Signed)
   [facility] Provider Note    Event Date/Time   First MD Initiated Contact with Patient 04/21/22 1409     (approximate)   History   Emesis and Diarrhea   HPI  Michelle Massey is a 31 y.o. female who presents with complaints of nausea vomiting diarrhea for approximately a day.  No sick contacts reported.  Intermittent abdominal cramping.     Physical Exam   Triage Vital Signs: ED Triage Vitals [04/21/22 1347]  Enc Vitals Group     BP (!) 111/90     Pulse Rate 72     Resp 16     Temp 98.7 F (37.1 C)     Temp Source Oral     SpO2 98 %     Weight 65.8 kg (145 lb)     Height 1.702 m (5\' 7" )     Head Circumference      Peak Flow      Pain Score 1     Pain Loc      Pain Edu?      Excl. in GC?     Most recent vital signs: Vitals:   04/21/22 1347  BP: (!) 111/90  Pulse: 72  Resp: 16  Temp: 98.7 F (37.1 C)  SpO2: 98%     General: Awake, no distress.  CV:  Good peripheral perfusion.  Resp:  Normal effort.  Abd:  No distention.  Soft, nontender Other:     ED Results / Procedures / Treatments   Labs (all labs ordered are listed, but only abnormal results are displayed) Labs Reviewed  CBC WITH DIFFERENTIAL/PLATELET - Abnormal; Notable for the following components:      Result Value   WBC 13.8 (*)    Neutro Abs 12.4 (*)    All other components within normal limits  RESP PANEL BY RT-PCR (RSV, FLU A&B, COVID)  RVPGX2  COMPREHENSIVE METABOLIC PANEL  LIPASE, BLOOD  POC URINE PREG, ED     EKG     RADIOLOGY     PROCEDURES:  Critical Care performed:   Procedures   MEDICATIONS ORDERED IN ED: Medications - No data to display   IMPRESSION / MDM / ASSESSMENT AND PLAN / ED COURSE  I reviewed the triage vital signs and the nursing notes. Patient's presentation is most consistent with acute illness / injury with system symptoms.  Patient presents with nausea vomiting diarrhea as above overall well-appearing and in no  acute distress.  Symptoms are most consistent with viral gastroenteritis which is prevalent in the community at this time.  No abdominal tenderness palpation, labs reviewed and are quite reassuring, recommend supportive care with ODT Zofran, outpatient follow-up, return precautions discussed, no indication for admission.        FINAL CLINICAL IMPRESSION(S) / ED DIAGNOSES   Final diagnoses:  Gastroenteritis     Rx / DC Orders   ED Discharge Orders          Ordered    ondansetron (ZOFRAN-ODT) 4 MG disintegrating tablet  Every 8 hours PRN        04/21/22 1426             Note:  This document was prepared using Dragon voice recognition software and may include unintentional dictation errors.   04/23/22, MD 04/21/22 [phone]

## 2022-07-20 ENCOUNTER — Ambulatory Visit: Payer: Medicaid Other | Admitting: Nurse Practitioner

## 2022-09-21 ENCOUNTER — Ambulatory Visit: Payer: Medicaid Other | Admitting: Dermatology

## 2022-09-21 VITALS — BP 110/76

## 2022-09-21 DIAGNOSIS — L931 Subacute cutaneous lupus erythematosus: Secondary | ICD-10-CM | POA: Diagnosis not present

## 2022-09-21 DIAGNOSIS — L308 Other specified dermatitis: Secondary | ICD-10-CM | POA: Diagnosis not present

## 2022-09-21 DIAGNOSIS — Z7189 Other specified counseling: Secondary | ICD-10-CM

## 2022-09-21 DIAGNOSIS — R21 Rash and other nonspecific skin eruption: Secondary | ICD-10-CM

## 2022-09-21 NOTE — Patient Instructions (Signed)

## 2022-09-21 NOTE — Progress Notes (Signed)
    New Patient Visit   Subjective  Michelle Massey is a 32 y.o. female who presents for the following: Rash of face, neck and chest that flared up over a year and half. She was diagnosed with SCLE at age 41 but she did not have any rash for years then just started flaring after her son was born in January 2022.   The following portions of the chart were reviewed this encounter and updated as appropriate: medications, allergies, medical history  Review of Systems:  No other skin or systemic complaints except as noted in HPI or Assessment and Plan.  Objective  Well appearing patient in no apparent distress; mood and affect are within normal limits.   A focused examination was performed of the following areas: Face, neck, chest  Relevant exam findings are noted in the Assessment and Plan.  Chest (2) Annular plaques with some erythema of chest and face. Peeling of lips. Rash is all photo distributed.               Assessment & Plan      Rash Chest; Chest  Skin / nail biopsy - Chest Type of biopsy: punch   Informed consent: discussed and consent obtained   Timeout: patient name, date of birth, surgical site, and procedure verified   Procedure prep:  Patient was prepped and draped in usual sterile fashion (the patient was cleaned and prepped) Prep type:  Isopropyl alcohol Anesthesia: the lesion was anesthetized in a standard fashion   Anesthetic:  1% lidocaine w/ epinephrine 1-100,000 buffered w/ 8.4% NaHCO3 Punch size:  3 mm Suture size:  4-0 Suture type: nylon   Hemostasis achieved with: suture, pressure and aluminum chloride   Outcome: patient tolerated procedure well   Post-procedure details: sterile dressing applied and wound care instructions given   Dressing type: bandage, petrolatum and pressure dressing    Skin / nail biopsy - Chest Type of biopsy: punch   Informed consent: discussed and consent obtained   Timeout: patient name, date of birth, surgical  site, and procedure verified   Procedure prep:  Patient was prepped and draped in usual sterile fashion (the patient was cleaned and prepped) Prep type:  Isopropyl alcohol Anesthesia: the lesion was anesthetized in a standard fashion   Anesthetic:  1% lidocaine w/ epinephrine 1-100,000 buffered w/ 8.4% NaHCO3 Punch size:  3 mm Suture size:  4-0 Suture type: nylon   Hemostasis achieved with: suture, pressure and aluminum chloride   Outcome: patient tolerated procedure well   Post-procedure details: sterile dressing applied and wound care instructions given   Dressing type: bandage, petrolatum and pressure dressing    Specimen 1 - Surgical pathology Differential Diagnosis: SCLE vs other   Check Margins: No  Specimen 2 - Surgical pathology Differential Diagnosis: SCLE vs other   Check Margins: No    Return in about 1 week (around 09/28/2022) for Biopsy Follow up.  I, Joanie Coddington, CMA, am acting as scribe for Armida Sans, MD .   Documentation: I have reviewed the above documentation for accuracy and completeness, and I agree with the above.  Armida Sans, MD

## 2022-09-27 ENCOUNTER — Telehealth: Payer: Self-pay

## 2022-09-27 NOTE — Telephone Encounter (Signed)
Discussed biopsy results with patient she would like to start Plaquenil tablets, discussed with patient plan on starting plaquenil at her office visit here next week June 13

## 2022-09-27 NOTE — Telephone Encounter (Signed)
-----   Message from Deirdre Evener, MD sent at 09/26/2022  8:24 PM EDT ----- Diagnosis 1. Skin , chest INTERFACE DERMATITIS, SEE DESCRIPTION 2. 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

## 2022-10-04 ENCOUNTER — Encounter: Payer: Self-pay | Admitting: Dermatology

## 2022-10-05 ENCOUNTER — Ambulatory Visit: Payer: Medicaid Other | Admitting: Dermatology

## 2022-10-05 VITALS — BP 97/67 | HR 70

## 2022-10-05 DIAGNOSIS — Z79899 Other long term (current) drug therapy: Secondary | ICD-10-CM | POA: Diagnosis not present

## 2022-10-05 DIAGNOSIS — L931 Subacute cutaneous lupus erythematosus: Secondary | ICD-10-CM | POA: Diagnosis not present

## 2022-10-05 DIAGNOSIS — Z7189 Other specified counseling: Secondary | ICD-10-CM

## 2022-10-05 DIAGNOSIS — L930 Discoid lupus erythematosus: Secondary | ICD-10-CM | POA: Diagnosis not present

## 2022-10-05 MED ORDER — MOMETASONE FUROATE 0.1 % EX CREA: TOPICAL_CREAM | CUTANEOUS | 0 refills | Status: DC

## 2022-10-05 NOTE — Progress Notes (Signed)
   Follow-Up Visit   Subjective  Michelle Massey is a 32 y.o. female who presents for the following: Suture removal at chest and for treatment  Pathology showed  Direct Immunofluorescence, chest NEGATIVE FOR IMMUNOREACTANTS most consistent with subacute cutaneous Lupus Erythematosus  The following portions of the chart were reviewed this encounter and updated as appropriate: medications, allergies, medical history  Review of Systems:  No other skin or systemic complaints except as noted in HPI or Assessment and Plan.  Objective  Well appearing patient in no apparent distress; mood and affect are within normal limits. Areas Examined: chest Relevant physical exam findings are noted in the Assessment and Plan.   Assessment & Plan   Subacute Cutaneous Lupus Erythematosus  Exam: see previous photos Bx proven  Treatment Plan: Reviewed CMP and CBC from 12/23 Will check Ana, CMP, CBC with Diff, Anti-Ro antibody, Anti La Antibody Will refer to [facility] for Retinal Exam  Pending labs and retinal eye exam will start Plaquenil   Start mometasone 0.1 % cream - apply topically to aa qd/bid  5 days weekly M-F.   Topical steroids (such as triamcinolone, fluocinolone, fluocinonide, mometasone, clobetasol, halobetasol, betamethasone, hydrocortisone) can cause thinning and lightening of the skin if they are used for too long in the same area. Your physician has selected the right strength medicine for your problem and area affected on the body. Please use your medication only as directed by your physician to prevent side effects.   Lupus erythematosus, unspecified form  Related Procedures Sjogrens syndrome-A extractable nuclear antibody Sjogrens syndrome-B extractable nuclear antibody Ambulatory referral to Ophthalmology  Related Medications mometasone (ELOCON) 0.1 % cream Apply topically to aa qd/bid m - f weekly.  Medication management  Related Procedures ANA w/Reflex CMP CBC  with Differential/Platelets  Encounter for Removal of Sutures - Incision site is clean, dry and intact. - Wound cleansed, sutures removed, wound cleansed and steri strips applied.  - Discussed pathology results showing  Direct Immunofluorescence, chest NEGATIVE FOR IMMUNOREACTANTS most consistent with subacute cutaneous Lupus Erythematosus - Patient advised to keep steri-strips dry until they fall off. - Scars remodel for a full year. - Once steri-strips fall off, patient can apply over-the-counter silicone scar cream once to twice a day to help with scar remodeling if desired. - Patient advised to call with any concerns or if they notice any new or changing lesions.  Return for 3 month follow up on lupus.  IAsher Muir, CMA, am acting as scribe for Armida Sans, MD.  Documentation: I have reviewed the above documentation for accuracy and completeness, and I agree with the above.  Armida Sans, MD

## 2022-10-05 NOTE — Patient Instructions (Addendum)
   After Suture Removal  If your medical team has placed Steri-Strips (white adhesive strips covering the surgical site to provide extra support): Keep the area dry until they fall off.  Do not peel them off. Just let them fall off on their own.  If the edges peel up, you can trim them with scissors.   If your team has not placed Steri-Strips: Wash the area daily with soap and water. Then coat the incision site with plain Vaseline and cover with a bandage. Do this daily for 5 days after the sutures are removed. After that, no additional wound care is generally needed.  However, if you would like to help fade the scar, you can apply a silicone scar cream, gel or sheet every night. The scar will remodel for one year after the procedure. If a skin cancer was removed, be sure to keep your appointment with your dermatologist for follow-up and let your dermatology team know if you have any new or changing spots between visits.    Please call our office at [phone] for any questions or concerns.Due to recent changes in healthcare laws, you may see results of your pathology and/or laboratory studies on MyChart before the doctors have had a chance to review them. We understand that in some cases there may be results that are confusing or concerning to you. Please understand that not all results are received at the same time and often the doctors may need to interpret multiple results in order to provide you with the best plan of care or course of treatment. Therefore, we ask that you please give us 2 business days to thoroughly review all your results before contacting the office for clarification. Should we see a critical lab result, you will be contacted sooner.   If You Need Anything After Your Visit  If you have any questions or concerns for your doctor, please call our main line at [phone] and press option 4 to reach your doctor's medical assistant. If no one answers, please leave a  voicemail as directed and we will return your call as soon as possible. Messages left after 4 pm will be answered the following business day.   You may also send us a message via MyChart. We typically respond to MyChart messages within 1-2 business days.  For prescription refills, please ask your pharmacy to contact our office. Our fax number is [phone].  If you have an urgent issue when the clinic is closed that cannot wait until the next business day, you can page your doctor at the number below.    Please note that while we do our best to be available for urgent issues outside of office hours, we are not available 24/7.   If you have an urgent issue and are unable to reach us, you may choose to seek medical care at your doctor's office, retail clinic, urgent care center, or emergency room.  If you have a medical emergency, please immediately call 911 or go to the emergency department.  Pager Numbers  - Dr. Kowalski: [phone]  - Dr. Moye: [phone]  - Dr. Stewart: [phone]  In the event of inclement weather, please call our main line at [phone] for an update on the status of any delays or closures.  Dermatology Medication Tips: Please keep the boxes that topical medications come in in order to help keep track of the instructions about where and how to use these. Pharmacies typically print the medication instructions only on the   boxes and not directly on the medication tubes.   If your medication is too expensive, please contact our office at [phone] option 4 or send us a message through MyChart.   We are unable to tell what your co-pay for medications will be in advance as this is different depending on your insurance coverage. However, we may be able to find a substitute medication at lower cost or fill out paperwork to get insurance to cover a needed medication.   If a prior authorization is required to get your medication covered by your insurance  company, please allow us 1-2 business days to complete this process.  Drug prices often vary depending on where the prescription is filled and some pharmacies may offer cheaper prices.  The website www.goodrx.com contains coupons for medications through different pharmacies. The prices here do not account for what the cost may be with help from insurance (it may be cheaper with your insurance), but the website can give you the price if you did not use any insurance.  - You can print the associated coupon and take it with your prescription to the pharmacy.  - You may also stop by our office during regular business hours and pick up a GoodRx coupon card.  - If you need your prescription sent electronically to a different pharmacy, notify our office through [facility] MyChart or by phone at [phone] option 4.     Si Usted Necesita Algo Despus de Su Visita  Tambin puede enviarnos un mensaje a travs de MyChart. Por lo general respondemos a los mensajes de MyChart en el transcurso de 1 a 2 das hbiles.  Para renovar recetas, por favor pida a su farmacia que se ponga en contacto con nuestra oficina. Nuestro nmero de fax es el [phone].  Si tiene un asunto urgente cuando la clnica est cerrada y que no puede esperar hasta el siguiente da hbil, puede llamar/localizar a su doctor(a) al nmero que aparece a continuacin.   Por favor, tenga en cuenta que aunque hacemos todo lo posible para estar disponibles para asuntos urgentes fuera del horario de oficina, no estamos disponibles las 24 horas del da, los 7 das de la semana.   Si tiene un problema urgente y no puede comunicarse con nosotros, puede optar por buscar atencin mdica  en el consultorio de su doctor(a), en una clnica privada, en un centro de atencin urgente o en una sala de emergencias.  Si tiene una emergencia mdica, por favor llame inmediatamente al 911 o vaya a la sala de emergencias.  Nmeros de bper  - Dr.  Kowalski: [phone]  - Dra. Moye: [phone]  - Dra. Stewart: [phone]  En caso de inclemencias del tiempo, por favor llame a nuestra lnea principal al [phone] para una actualizacin sobre el estado de cualquier retraso o cierre.  Consejos para la medicacin en dermatologa: Por favor, guarde las cajas en las que vienen los medicamentos de uso tpico para ayudarle a seguir las instrucciones sobre dnde y cmo usarlos. Las farmacias generalmente imprimen las instrucciones del medicamento slo en las cajas y no directamente en los tubos del medicamento.   Si su medicamento es muy caro, por favor, pngase en contacto con nuestra oficina llamando al [phone] y presione la opcin 4 o envenos un mensaje a travs de MyChart.   No podemos decirle cul ser su copago por los medicamentos por adelantado ya que esto es diferente dependiendo de la cobertura de su seguro. Sin embargo, es posible que podamos   encontrar un medicamento sustituto a menor costo o llenar un formulario para que el seguro cubra el medicamento que se considera necesario.   Si se requiere una autorizacin previa para que su compaa de seguros cubra su medicamento, por favor permtanos de 1 a 2 das hbiles para completar este proceso.  Los precios de los medicamentos varan con frecuencia dependiendo del lugar de dnde se surte la receta y alguna farmacias pueden ofrecer precios ms baratos.  El sitio web www.goodrx.com tiene cupones para medicamentos de diferentes farmacias. Los precios aqu no tienen en cuenta lo que podra costar con la ayuda del seguro (puede ser ms barato con su seguro), pero el sitio web puede darle el precio si no utiliz ningn seguro.  - Puede imprimir el cupn correspondiente y llevarlo con su receta a la farmacia.  - Tambin puede pasar por nuestra oficina durante el horario de atencin regular y recoger una tarjeta de cupones de GoodRx.  - Si necesita que su receta se enve  electrnicamente a una farmacia diferente, informe a nuestra oficina a travs de MyChart de [facility] o por telfono llamando al [phone] y presione la opcin 4.  

## 2022-10-07 ENCOUNTER — Encounter: Payer: Self-pay | Admitting: Dermatology

## 2022-12-28 ENCOUNTER — Telehealth: Payer: Self-pay

## 2022-12-28 NOTE — Telephone Encounter (Signed)
Patient informed of lab results. 

## 2022-12-28 NOTE — Telephone Encounter (Signed)
-----   Message from Armida Sans sent at 12/28/2022 12:34 PM EDT ----- Lab from 12/27/2022 shows: ANA = negative Blood counts OK = normal Chemistries including liver and kidney all OK = normal  Keep follow up for SCLE on Plaquenil

## 2023-01-03 ENCOUNTER — Ambulatory Visit (INDEPENDENT_AMBULATORY_CARE_PROVIDER_SITE_OTHER): Payer: Medicaid Other | Admitting: Dermatology

## 2023-01-03 DIAGNOSIS — L578 Other skin changes due to chronic exposure to nonionizing radiation: Secondary | ICD-10-CM

## 2023-01-03 DIAGNOSIS — L931 Subacute cutaneous lupus erythematosus: Secondary | ICD-10-CM | POA: Diagnosis not present

## 2023-01-03 DIAGNOSIS — Z79899 Other long term (current) drug therapy: Secondary | ICD-10-CM

## 2023-01-03 DIAGNOSIS — Z7189 Other specified counseling: Secondary | ICD-10-CM

## 2023-01-03 MED ORDER — HYDROXYCHLOROQUINE SULFATE 200 MG PO TABS: ORAL_TABLET | ORAL | 2 refills | Status: DC

## 2023-01-03 NOTE — Progress Notes (Signed)
   Follow-Up Visit   Subjective  Michelle Massey is a 32 y.o. female who presents for the following: 3 months f/u on Lupus on her skin using Mometasone cream with a good response, patient was never prescribed Plaquenil tablets, she recently had labs drawn.  The patient has spots, moles and lesions to be evaluated, some may be new or changing and the patient may have concern these could be cancer.   The following portions of the chart were reviewed this encounter and updated as appropriate: medications, allergies, medical history  Review of Systems:  No other skin or systemic complaints except as noted in HPI or Assessment and Plan.  Objective  Well appearing patient in no apparent distress; mood and affect are within normal limits.  A focused examination was performed of the following areas:   Relevant exam findings are noted in the Assessment and Plan.    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 Condition is improving with treatment but not currently at goal.    Exam: see  photos Bx proven Labs reviewed dated 12/27/2022   Treatment Plan: Will refer to [facility] for Retinal Exam Start Plaquenil 200 mg take 1 tablet daily  Plaquenil eye screening protocol:  All patients need baseline screening eye exam prior to starting Plaquenil, document start date of medication in chart  Start Plaquenil 200 mg PO bid x 3 months, then decrease to body weight-based dose <5 mg/kg day as appropriate.  If no visual symptoms or underlying risk factors, do not need yearly eye screening exams year 1-4 if appropriately dosed by weight as above.  Need yearly eye screening exam if have risk factors, including renal insufficiency, diabetes, maculopathy on baseline, tamoxifen use, >5 mg/kg dosing, and/or have been taking Plaquenil for 5 years or more.  Risk of maculopathy due to Plaquenil markedly  increases after 10 years of use. Will refer to [facility] for Retinal Exam    Continue  mometasone 0.1 % cream - apply topically to aa qd/bid  5 days weekly M-F.    Topical steroids (such as triamcinolone, fluocinolone, fluocinonide, mometasone, clobetasol, halobetasol, betamethasone, hydrocortisone) can cause thinning and lightening of the skin if they are used for too long in the same area. Your physician has selected the right strength medicine for your problem and area affected on the body. Please use your medication only as directed by your physician to prevent side effects.    Lupus erythematosus, unspecified form    refer to [facility]   Return in about 4 months (around 05/05/2023) for Lupus .  IAngelique Holm, CMA, am acting as scribe for Armida Sans, MD .   Documentation: I have reviewed the above documentation for accuracy and completeness, and I agree with the above.  Armida Sans, MD

## 2023-01-03 NOTE — Patient Instructions (Signed)

## 2023-01-05 ENCOUNTER — Encounter: Payer: Self-pay | Admitting: Dermatology

## 2023-02-27 ENCOUNTER — Ambulatory Visit: Payer: Medicaid Other | Admitting: Cardiology

## 2023-02-27 ENCOUNTER — Encounter: Payer: Self-pay | Admitting: Cardiology

## 2023-02-27 VITALS — BP 132/75 | HR 97 | Ht 65.0 in | Wt 142.2 lb

## 2023-02-27 DIAGNOSIS — M545 Low back pain, unspecified: Secondary | ICD-10-CM | POA: Diagnosis not present

## 2023-02-27 DIAGNOSIS — Z13220 Encounter for screening for lipoid disorders: Secondary | ICD-10-CM

## 2023-02-27 DIAGNOSIS — G8929 Other chronic pain: Secondary | ICD-10-CM | POA: Diagnosis not present

## 2023-02-27 DIAGNOSIS — Z131 Encounter for screening for diabetes mellitus: Secondary | ICD-10-CM

## 2023-02-27 DIAGNOSIS — Z0130 Encounter for examination of blood pressure without abnormal findings: Secondary | ICD-10-CM

## 2023-02-27 MED ORDER — MELOXICAM 15 MG PO TABS: 15.0000 mg | ORAL_TABLET | Freq: Every day | ORAL | 3 refills | Status: DC

## 2023-02-27 NOTE — Progress Notes (Signed)
Established Patient Office Visit  Subjective:  Patient ID: Michelle Massey, female    DOB: [birth year]  Age: 32 y.o. MRN: [id number]  Chief Complaint  Patient presents with   Follow-up    Lower back pain Something for pain    Patient in office for yearly follow up, discuss recent lab results. Patient feeling well. Complaints of chronic back pain today. Will send in Mobic.  Recent lab work normal. Lipid panel and Hgb A1c not done. Patient fasting today. Will order labs.     No other concerns at this time.   Past Medical History:  Diagnosis Date   Collagen vascular disease (HCC)    Lupus    Scoliosis     Past Surgical History:  Procedure Laterality Date   NO PAST SURGERIES      Social History   Socioeconomic History   Marital status: Single    Spouse name: Not on file   Number of children: 0   Years of education: 10-11   Highest education level: Not on file  Occupational History   Not on file  Tobacco Use   Smoking status: Former    Current packs/day: 0.00    Average packs/day: 0.5 packs/day for 6.7 years (3.4 ttl pk-yrs)    Types: Cigarettes    Start date: 2013    Quit date: 01/22/2018    Years since quitting: 5.1   Smokeless tobacco: Never  Vaping Use   Vaping status: Never Used  Substance and Sexual Activity   Alcohol use: Not Currently   Drug use: Yes    Types: Marijuana    Comment: Quit at pregnancy-June 2021   Sexual activity: Yes    Birth control/protection: None  Other Topics Concern   Not on file  Social History Narrative   Not on file   Social Determinants of Health   Financial Resource Strain: Not on file  Food Insecurity: Not on file  Transportation Needs: Not on file  Physical Activity: Not on file  Stress: Not on file  Social Connections: Not on file  Intimate Partner Violence: Not on file    Family History  Problem Relation Age of Onset   Hodgkin's lymphoma Mother    Hypertension Mother    Cancer Mother    Asthma Mother     Bipolar disorder Sister    Schizophrenia Sister    Diabetes Maternal Great-grandfather    Hypertension Maternal Great-grandfather    Breast cancer Neg Hx    Ovarian cancer Neg Hx    Colon cancer Neg Hx     No Known Allergies  Review of Systems  Constitutional: Negative.   HENT: Negative.    Eyes: Negative.   Respiratory: Negative.  Negative for shortness of breath.   Cardiovascular: Negative.  Negative for chest pain.  Gastrointestinal: Negative.  Negative for abdominal pain, constipation and diarrhea.  Genitourinary: Negative.   Musculoskeletal:  Positive for back pain. Negative for joint pain and myalgias.  Skin: Negative.   Neurological: Negative.  Negative for dizziness and headaches.  Endo/Heme/Allergies: Negative.   All other systems reviewed and are negative.      Objective:   BP 132/75   Pulse 97   Ht 5\' 5"  (1.651 m)   Wt 142 lb 3.2 oz (64.5 kg)   LMP 02/13/2023   SpO2 97%   BMI 23.66 kg/m   Vitals:   02/27/23 0946  BP: 132/75  Pulse: 97  Height: 5\' 5"  (1.651 m)  Weight: 142 lb 3.2  oz (64.5 kg)  SpO2: 97%  BMI (Calculated): 23.66    Physical Exam Vitals and nursing note reviewed.  Constitutional:      Appearance: Normal appearance. She is normal weight.  HENT:     Head: Normocephalic and atraumatic.     Nose: Nose normal.     Mouth/Throat:     Mouth: Mucous membranes are moist.  Eyes:     Extraocular Movements: Extraocular movements intact.     Conjunctiva/sclera: Conjunctivae normal.     Pupils: Pupils are equal, round, and reactive to light.  Cardiovascular:     Rate and Rhythm: Normal rate and regular rhythm.     Pulses: Normal pulses.     Heart sounds: Normal heart sounds.  Pulmonary:     Effort: Pulmonary effort is normal.     Breath sounds: Normal breath sounds.  Abdominal:     General: Abdomen is flat. Bowel sounds are normal.     Palpations: Abdomen is soft.  Musculoskeletal:        General: Normal range of motion.     Cervical  back: Normal range of motion.  Skin:    General: Skin is warm and dry.  Neurological:     General: No focal deficit present.     Mental Status: She is alert and oriented to person, place, and time.  Psychiatric:        Mood and Affect: Mood normal.        Behavior: Behavior normal.        Thought Content: Thought content normal.        Judgment: Judgment normal.      No results found for any visits on 02/27/23.  Recent Results (from the past 2160 hour(s))  ANA w/Reflex     Status: None   Collection Time: 12/27/22 10:24 AM  Result Value Ref Range   Anti Nuclear Antibody (ANA) Negative Negative  CMP     Status: None   Collection Time: 12/27/22 10:24 AM  Result Value Ref Range   Glucose 89 70 - 99 mg/dL   BUN 11 6 - 20 mg/dL   Creatinine, Ser 1.61 0.57 - 1.00 mg/dL   eGFR 96 >09 UE/AVW/0.98   BUN/Creatinine Ratio 13 9 - 23   Sodium 140 134 - 144 mmol/L   Potassium 4.2 3.5 - 5.2 mmol/L   Chloride 103 96 - 106 mmol/L   CO2 24 20 - 29 mmol/L   Calcium 9.7 8.7 - 10.2 mg/dL   Total Protein 7.1 6.0 - 8.5 g/dL   Albumin 4.6 3.9 - 4.9 g/dL   Globulin, Total 2.5 1.5 - 4.5 g/dL   Bilirubin Total 0.5 0.0 - 1.2 mg/dL   Alkaline Phosphatase 54 44 - 121 IU/L   AST 16 0 - 40 IU/L   ALT 18 0 - 32 IU/L  CBC with Differential/Platelets     Status: None   Collection Time: 12/27/22 10:24 AM  Result Value Ref Range   WBC 4.1 3.4 - 10.8 x10E3/uL   RBC 4.61 3.77 - 5.28 x10E6/uL   Hemoglobin 13.8 11.1 - 15.9 g/dL   Hematocrit 11.9 14.7 - 46.6 %   MCV 90 79 - 97 fL   MCH 29.9 26.6 - 33.0 pg   MCHC 33.3 31.5 - 35.7 g/dL   RDW 82.9 56.2 - 13.0 %   Platelets 280 150 - 450 x10E3/uL   Neutrophils 52 Not Estab. %   Lymphs 39 Not Estab. %   Monocytes 7 Not Estab. %  Eos 1 Not Estab. %   Basos 1 Not Estab. %   Neutrophils Absolute 2.2 1.4 - 7.0 x10E3/uL   Lymphocytes Absolute 1.6 0.7 - 3.1 x10E3/uL   Monocytes Absolute 0.3 0.1 - 0.9 x10E3/uL   EOS (ABSOLUTE) 0.0 0.0 - 0.4 x10E3/uL    Basophils Absolute 0.0 0.0 - 0.2 x10E3/uL   Immature Granulocytes 0 Not Estab. %   Immature Grans (Abs) 0.0 0.0 - 0.1 x10E3/uL      Assessment & Plan:  Fasting lab work today. Meloxicam for back pain.   Problem List Items Addressed This Visit       Other   Lipid screening - Primary   Relevant Orders   Lipid Profile   Chronic bilateral low back pain without sciatica   Relevant Medications   meloxicam (MOBIC) 15 MG tablet    Return in about 1 year (around 02/27/2024).   Total time spent: 25 minutes  [employer], NP  02/27/2023   This document may have been prepared by Dragon Voice Recognition software and as such may include unintentional dictation errors.

## 2023-02-28 LAB — HEMOGLOBIN A1C
Est. average glucose Bld gHb Est-mCnc: 105 mg/dL
Hgb A1c MFr Bld: 5.3 % (ref 4.8–5.6)

## 2023-02-28 LAB — LIPID PANEL
Chol/HDL Ratio: 3.1 ratio (ref 0.0–4.4)
Cholesterol, Total: 164 mg/dL (ref 100–199)
HDL: 53 mg/dL (ref >39)
LDL Chol Calc (NIH): 95 mg/dL (ref 0–99)
Triglycerides: 86 mg/dL (ref 0–149)
VLDL Cholesterol Cal: 16 mg/dL (ref 5–40)

## 2023-04-25 NOTE — L&D Delivery Note (Signed)
 Date of delivery: 12/06/2023 Estimated Date of Delivery: 12/16/23 Patient's last menstrual period was 03/11/2023. EGA: [redacted]w[redacted]d  Delivery Note At 12:49 AM a viable female was delivered via Vaginal, Spontaneous  Presentation: OA, Right Occiput Anterior   APGAR: 8, 8; weight 9 lb 2.7 oz (4160 g).   Placenta status: Spontaneous, Intact.   Cord: 3 vessels with the following complications: None.  Cord pH: NA  Mom pushed 4 minutes with excellent effort to deliver a viable female infant.  The head followed by tight shoulders, which delivered after lowering patient head, and the rest of the body.  Compound loop of umbilical cord delivered with shoulders.  Baby to mom's chest.  Cord clamped and cut after 3 min delay. Baby taken to warmer for evaluation of O2 saturation due to purple color of face. O2 sats normal. Likely bruising from quick delivery.  Cord blood obtained.  Placenta delivered spontaneously, intact, with a 3-vessel cord. All counts correct. Hemostasis obtained with IV pitocin /fundal massage.    Anesthesia: Epidural Episiotomy: None Lacerations: hemostatic abrasion left vaginal wall  Suture Repair: NA Est. Blood Loss (mL): 300  Mom to postpartum.  Baby to Couplet care / Skin to Skin.  Slater Rains, CNM 12/06/2023, 1:23 AM

## 2023-05-07 ENCOUNTER — Ambulatory Visit: Payer: Medicaid Other

## 2023-05-07 VITALS — BP 114/65 | HR 100 | Ht 64.25 in | Wt 148.2 lb

## 2023-05-07 DIAGNOSIS — Z3201 Encounter for pregnancy test, result positive: Secondary | ICD-10-CM

## 2023-05-07 DIAGNOSIS — N912 Amenorrhea, unspecified: Secondary | ICD-10-CM

## 2023-05-07 DIAGNOSIS — Z789 Other specified health status: Secondary | ICD-10-CM

## 2023-05-07 LAB — POCT URINE PREGNANCY: Preg Test, Ur: POSITIVE — AB

## 2023-05-07 MED ORDER — VITAFOL ULTRA 29-0.6-0.4-200 MG PO CAPS: 1.0000 | ORAL_CAPSULE | Freq: Every day | ORAL | Status: AC

## 2023-05-07 NOTE — Patient Instructions (Addendum)
 Morning Sickness  Morning sickness is when you feel like you may vomit (feel nauseous) during pregnancy. Sometimes, you may vomit. Morning sickness most often happens in the morning, but it can also happen at any time of the day. Some women may have morning sickness that makes them vomit all the time. This is a more serious problem that needs treatment. What are the causes? The cause of this condition is not known. What increases the risk? You had vomiting or a feeling like you may vomit before your pregnancy. You had morning sickness in another pregnancy. You are pregnant with more than one baby, such as twins. What are the signs or symptoms? Feeling like you may vomit. Vomiting. How is this treated? Treatment is usually not needed for this condition. You may only need to change what you eat. In some cases, your doctor may give you some things to take for your condition. These include: Vitamin B6 supplements. Medicines to treat the feeling that you may vomit. Ginger. Follow these instructions at home: Medicines Take over-the-counter and prescription medicines only as told by your doctor. Do not take any medicines until you talk with your doctor about them first. Take multivitamins before you get pregnant. These can stop or lessen the symptoms of morning sickness. Eating and drinking Eat dry toast or crackers before getting out of bed. Eat 5 or 6 small meals a day. Eat dry and bland foods like rice and baked potatoes. Do not eat greasy, fatty, or spicy foods. Have someone cook for you if the smell of food causes you to vomit or to feel like you may vomit. If you feel like you may vomit after taking prenatal vitamins, take them at night or with a snack. Eat protein foods when you need a snack. Nuts, yogurt, and cheese are good choices. Drink fluids throughout the day. Try ginger ale made with real ginger, ginger tea made from fresh grated ginger, or ginger candies. General  instructions Do not smoke or use any products that contain nicotine  or tobacco. If you need help quitting, ask your doctor. Use an air purifier to keep the air in your house free of smells. Get lots of fresh air. Try to avoid smells that make you feel sick. Try wearing an acupressure wristband. This is a wristband that is used to treat seasickness. Try a treatment called acupuncture. In this treatment, a doctor puts needles into certain areas of your body to make you feel better. Contact a doctor if: You need medicine to feel better. You feel dizzy or light-headed. You are losing weight. Get help right away if: The feeling that you may vomit will not go away, or you cannot stop vomiting. You faint. You have very bad pain in your belly. Summary Morning sickness is when you feel like you may vomit (feel nauseous) during pregnancy. You may feel sick in the morning, but you can feel this way at any time of the day. Making some changes to what you eat may help your symptoms go away. This information is not intended to replace advice given to you by your health care provider. Make sure you discuss any questions you have with your health care provider. Document Revised: 11/24/2019 Document Reviewed: 11/03/2019 Elsevier Patient Education  2024 [employer].  First Trimester Pregnancy  The first trimester of pregnancy starts on the first day of your last menstrual period until the end of week 12. This is also called months 1 through 3 of pregnancy. Body changes  during your first trimester Your body goes through many changes during pregnancy. The changes usually return to normal after your baby is born. Physical changes You may gain or lose weight. Your breasts may grow larger and hurt. The area around your nipples may get darker. Dark spots or blotches may develop on your face. You may have changes in your hair. Health changes You may feel like you might vomit (nauseous), and you may  vomit. You may have heartburn. You may have headaches. You may have trouble pooping (constipation). Your gums may bleed. Other changes You may get tired easily. You may pee (urinate) more often. Your menstrual periods will stop. You may not feel hungry. You may want to eat certain kinds of food. You may have changes in your emotions from day to day. You may have more dreams. Follow these instructions at home: Medicines Take over-the-counter and prescription medicines only as told by your doctor. Some medicines are not safe during pregnancy. Take a prenatal vitamin that contains at least 600 micrograms (mcg) of folic acid. Eating and drinking Eat healthy meals that include: Fresh fruits and vegetables. Whole grains. Good sources of protein, such as meat, eggs, or tofu. Low-fat dairy products. Avoid raw meat and unpasteurized juice, milk, and cheese. If you feel like you may vomit, or you vomit: Eat 4 or 5 small meals a day instead of 3 large meals. Try eating a few soda crackers. Drink liquids between meals instead of during meals. You may need to take these actions to prevent or treat trouble pooping: Drink enough fluids to keep your pee (urine) pale yellow. Eat foods that are high in fiber. These include beans, whole grains, and fresh fruits and vegetables. Limit foods that are high in fat and sugar. These include fried or sweet foods. Activity Exercise only as told by your doctor. Most people can do their usual exercise routine during pregnancy. Stop exercising if you have cramps or pain in your lower belly (abdomen) or low back. Do not exercise if it is too hot or too humid, or if you are in a place of great height (high altitude). Avoid heavy lifting. If you choose to, you may have sex unless your doctor tells you not to. Relieving pain and discomfort Wear a good support bra if your breasts are sore. Rest with your legs raised (elevated) if you have leg cramps or low back  pain. If you have bulging veins (varicose veins) in your legs: Wear support hose as told by your doctor. Raise your feet for 15 minutes, 3-4 times a day. Limit salt in your food. Safety Wear your seat belt at all times when you are in a car. Talk with your doctor if someone is hurting you or yelling at you. Talk with your doctor if you are feeling sad or have thoughts of hurting yourself. Lifestyle Do not use hot tubs, steam rooms, or saunas. Do not douche. Do not use tampons or scented sanitary pads. Do not use herbal medicines, illegal drugs, or medicines that are not approved by your doctor. Do not drink alcohol. Do not smoke or use any products that contain nicotine  or tobacco. If you need help quitting, ask your doctor. Avoid cat litter boxes and soil that is used by cats. These carry germs that can cause harm to the baby and can cause a loss of your baby by miscarriage or stillbirth. General instructions Keep all follow-up visits. This is important. Ask for help if you need counseling or  if you need help with nutrition. Your doctor can give you advice or tell you where to go for help. Visit your dentist. At home, brush your teeth with a soft toothbrush. Floss gently. Write down your questions. Take them to your prenatal visits. Where to find more information American Pregnancy Association: americanpregnancy.org [employer]: www.acog.org Office on Women's Health: mightyreward.co.nz Contact a doctor if: You are dizzy. You have a fever. You have mild cramps or pressure in your lower belly. You have a nagging pain in your belly area. You continue to feel like you may vomit, you vomit, or you have watery poop (diarrhea) for 24 hours or longer. You have a bad-smelling fluid coming from your vagina. You have pain when you pee. You are exposed to a disease that spreads from person to person, such as chickenpox, measles, Zika virus, HIV, or  hepatitis. Get help right away if: You have spotting or bleeding from your vagina. You have very bad belly cramping or pain. You have shortness of breath or chest pain. You have any kind of injury, such as from a fall or a car crash. You have new or increased pain, swelling, or redness in an arm or leg. Summary The first trimester of pregnancy starts on the first day of your last menstrual period until the end of week 12 (months 1 through 3). Eat 4 or 5 small meals a day instead of 3 large meals. Do not smoke or use any products that contain nicotine  or tobacco. If you need help quitting, ask your doctor. Keep all follow-up visits. This information is not intended to replace advice given to you by your health care provider. Make sure you discuss any questions you have with your health care provider. Document Revised: 09/17/2019 Document Reviewed: 07/24/2019 Elsevier Patient Education  2024 [employer]  Commonly Asked Questions During Pregnancy  Cats: A parasite can be excreted in cat feces.  To avoid exposure you need to have another person empty the little box.  If you must empty the litter box you will need to wear gloves.  Wash your hands after handling your cat.  This parasite can also be found in raw or undercooked meat so this should also be avoided.  Colds, Sore Throats, Flu: Please check your medication sheet to see what you can take for symptoms.  If your symptoms are unrelieved by these medications please call the office.  Dental Work: Most any dental work agricultural consultant recommends is permitted.  X-rays should only be taken during the first trimester if absolutely necessary.  Your abdomen should be shielded with a lead apron during all x-rays.  Please notify your provider prior to receiving any x-rays.  Novocaine is fine; gas is not recommended.  If your dentist requires a note from us  prior to dental work please call the office and we will provide one for you.  Exercise: Exercise  is an important part of staying healthy during your pregnancy.  You may continue most exercises you were accustomed to prior to pregnancy.  Later in your pregnancy you will most likely notice you have difficulty with activities requiring balance like riding a bicycle.  It is important that you listen to your body and avoid activities that put you at a higher risk of falling.  Adequate rest and staying well hydrated are a must!  If you have questions about the safety of specific activities ask your provider.    Exposure to Children with illness: Try  to avoid obvious exposure; report any symptoms to us  when noted,  If you have chicken pos, red measles or mumps, you should be immune to these diseases.   Please do not take any vaccines while pregnant unless you have checked with your OB provider.  Fetal Movement: After 28 weeks we recommend you do kick counts twice daily.  Lie or sit down in a calm quiet environment and count your baby movements kicks.  You should feel your baby at least 10 times per hour.  If you have not felt 10 kicks within the first hour get up, walk around and have something sweet to eat or drink then repeat for an additional hour.  If count remains less than 10 per hour notify your provider.  Fumigating: Follow your pest control agent's advice as to how long to stay out of your home.  Ventilate the area well before re-entering.  Hemorrhoids:   Most over-the-counter preparations can be used during pregnancy.  Check your medication to see what is safe to use.  It is important to use a stool softener or fiber in your diet and to drink lots of liquids.  If hemorrhoids seem to be getting worse please call the office.   Hot Tubs:  Hot tubs Jacuzzis and saunas are not recommended while pregnant.  These increase your internal body temperature and should be avoided.  Intercourse:  Sexual intercourse is safe during pregnancy as long as you are comfortable, unless otherwise advised by your  provider.  Spotting may occur after intercourse; report any bright red bleeding that is heavier than spotting.  Labor:  If you know that you are in labor, please go to the hospital.  If you are unsure, please call the office and let us  help you decide what to do.  Lifting, straining, etc:  If your job requires heavy lifting or straining please check with your provider for any limitations.  Generally, you should not lift items heavier than that you can lift simply with your hands and arms (no back muscles)  Painting:  Paint fumes do not harm your pregnancy, but may make you ill and should be avoided if possible.  Latex or water based paints have less odor than oils.  Use adequate ventilation while painting.  Permanents & Hair Color:  Chemicals in hair dyes are not recommended as they cause increase hair dryness which can increase hair loss during pregnancy.   Highlighting and permanents are allowed.  Dye may be absorbed differently and permanents may not hold as well during pregnancy.  Sunbathing:  Use a sunscreen, as skin burns easily during pregnancy.  Drink plenty of fluids; avoid over heating.  Tanning Beds:  Because their possible side effects are still unknown, tanning beds are not recommended.  Ultrasound Scans:  Routine ultrasounds are performed at approximately 20 weeks.  You will be able to see your baby's general anatomy an if you would like to know the gender this can usually be determined as well.  If it is questionable when you conceived you may also receive an ultrasound early in your pregnancy for dating purposes.  Otherwise ultrasound exams are not routinely performed unless there is a medical necessity.  Although you can request a scan we ask that you pay for it when conducted because insurance does not cover  patient request scans.  Work: If your pregnancy proceeds without complications you may work until your due date, unless your physician or employer advises  otherwise.  Round Ligament Pain/Pelvic  Discomfort:  Sharp, shooting pains not associated with bleeding are fairly common, usually occurring in the second trimester of pregnancy.  They tend to be worse when standing up or when you remain standing for long periods of time.  These are the result of pressure of certain pelvic ligaments called round ligaments.  Rest, Tylenol  and heat seem to be the most effective relief.  As the womb and fetus grow, they rise out of the pelvis and the discomfort improves.  Please notify the office if your pain seems different than that described.  It may represent a more serious condition.

## 2023-05-07 NOTE — Progress Notes (Signed)
    NURSE VISIT NOTE  Subjective:    Patient ID: Michelle Massey, female    DOB: 09/28/1990, 33 y.o.   MRN: [id number]  HPI  Patient is a 33 y.o. G7P1001 female who presents for evaluation of amenorrhea. She believes she could be pregnant. Pregnancy is desired. Sexual Activity: single partner, contraception: none. Current symptoms also include: breast tenderness, fatigue, frequent urination, morning sickness, nausea, and positive home pregnancy test. Last period was normal.    Objective:    Ht 5' 4.25 (1.632 m)   Wt 148 lb 3.2 oz (67.2 kg)   LMP 03/11/2023   BMI 25.24 kg/m   Lab Review  Results for orders placed or performed in visit on 05/07/23  POCT urine pregnancy  Result Value Ref Range   Preg Test, Ur Positive (A) Negative    Assessment:   1. Amenorrhea     Plan:   Pregnancy Test: Positive  Estimated Date of Delivery: None noted. BP Cuff Measurement taken. Cuff Size Adult Small (25cm) Encouraged well-balanced diet, plenty of rest when needed, pre-natal vitamins daily and walking for exercise.  Discussed self-help for nausea, avoiding OTC medications until consulting provider or pharmacist, other than Tylenol  as needed, minimal caffeine (1-2 cups daily) and avoiding alcohol.   She will schedule her nurse visit @ 7-[redacted] wks pregnant, u/s for dating @10  wk, and NOB visit at [redacted] wk pregnant.    Feel free to call with any questions.    Camelia Fetters, CMA [facility]

## 2023-05-09 ENCOUNTER — Ambulatory Visit: Payer: Medicaid Other | Admitting: Dermatology

## 2023-05-16 ENCOUNTER — Telehealth: Payer: Medicaid Other

## 2023-05-18 ENCOUNTER — Telehealth (INDEPENDENT_AMBULATORY_CARE_PROVIDER_SITE_OTHER): Payer: Medicaid Other

## 2023-05-18 VITALS — Ht 64.25 in | Wt 148.0 lb

## 2023-05-18 DIAGNOSIS — Z3A09 9 weeks gestation of pregnancy: Secondary | ICD-10-CM

## 2023-05-18 DIAGNOSIS — M329 Systemic lupus erythematosus, unspecified: Secondary | ICD-10-CM

## 2023-05-18 DIAGNOSIS — O0990 Supervision of high risk pregnancy, unspecified, unspecified trimester: Secondary | ICD-10-CM

## 2023-05-18 DIAGNOSIS — O99891 Other specified diseases and conditions complicating pregnancy: Secondary | ICD-10-CM

## 2023-05-18 DIAGNOSIS — Z3481 Encounter for supervision of other normal pregnancy, first trimester: Secondary | ICD-10-CM

## 2023-05-18 NOTE — Progress Notes (Signed)
New OB Intake  I connected with  Michelle Massey on 05/18/23 at  2:15 PM EST by MyChart Video Visit and verified that I am speaking with the correct person using two identifiers. Nurse is located at [facility] and pt is located at home.  I discussed the limitations, risks, security and privacy concerns of performing an evaluation and management service by telephone and the availability of in person appointments. I also discussed with the patient that there may be a patient responsible charge related to this service. The patient expressed understanding and agreed to proceed.  I explained I am completing New OB Intake today. We discussed her EDD of 12/16/23 that is based on LMP of 03/11/23. Pt is G2/P1001. I reviewed her allergies, medications, Medical/Surgical/OB history, and appropriate screenings. There are cats in the home in the home, No. Based on history, this is a/an pregnancy complicated by systemic Lupus  . Her obstetrical history is significant for  postpartum hemorrhage .  Patient Active Problem List   Diagnosis Date Noted   Supervision of high risk pregnancy, antepartum 05/18/2023   Chronic bilateral low back pain without sciatica 02/27/2023   Encounter for postpartum care after hospital delivery 04/28/2020   Lipid screening 04/28/2020   Systemic lupus complicating pregnancy (HCC) 12/01/2019   RLQ abdominal pain 10/07/2019   Discoid lupus erythematosus 08/29/2013    Concerns addressed today  Delivery Plans:  Plans to deliver at [facility]  Dating [place] Explained first scheduled [place] will be around 9-10 weeks. Dating [place] scheduled for 05/24/23 at 3:00. Pt notified to arrive at 2:45.  Labs Discussed genetic screening with patient. Patient wants genetic testing to be drawn at new OB visit. Discussed possible labs to be drawn at new OB appointment.  COVID Vaccine Patient has not had COVID vaccine.   Social Determinants of Health Food Insecurity: denies food  insecurity WIC Referral: Patient is not interested in referral to [facility] already enrolled in [facility].  Transportation: Patient denies transportation needs. Childcare: Discussed no children allowed at ultrasound appointments.   First visit review I reviewed new OB appt with pt. I explained she will have ob bloodwork and pap smear/pelvic exam if indicated. Explained pt will be seen by Guadlupe Spanish, CNM at first visit; encounter routed to appropriate provider.   Loman Chroman, CMA 05/18/2023  2:32 PM

## 2023-05-18 NOTE — Patient Instructions (Signed)
Prenatal Classes offered through [facility] [email or web address]   Hospital Tour [email or web address] Tours are on the 3rd Sunday of the month at 3 pm. Register online, there are only 5 openings a month so register early to guarantee your spot.     First Trimester of Pregnancy  The first trimester of pregnancy starts on the first day of your last monthly period until the end of week 13. This is months 1 through 3 of pregnancy. A week after a sperm fertilizes an egg, the egg will implant into the wall of the uterus and begin to develop into a baby. Body changes during your first trimester Your body goes through many changes during pregnancy. The changes usually return to normal after your baby is born. Physical changes Your breasts may grow larger and may hurt. The area around your nipples may get darker. Your periods will stop. Your hair and nails may grow faster. You may pee more often. Health changes You may tire easily. Your gums may bleed and may be sensitive when you brush and floss. You may not feel hungry. You may have heartburn. You may throw up or feel like you may throw up. You may want to eat some foods, but not others. You may have headaches. You may have trouble pooping (constipation). Other changes Your emotions may change from day to day. You may have more dreams. Follow these instructions at home: Medicines Talk to your health care provider if you're taking medicines. Ask if the medicines are safe to take during pregnancy. Your provider may change the medicines that you take. Do not take any medicines unless told to by your provider. Take a prenatal vitamin that has at least 600 micrograms (mcg) of folic acid. Do not use herbal medicines, illegal substances, or medicines that  are not approved by your provider. Eating and drinking While you're pregnant your body needs extra food for your growing baby. Talk with your provider about what to eat while pregnant. Activity Most women are able to exercise during pregnancy. Exercises may need to change as your pregnancy goes on. Talk to your provider about your activities and exercise routines. Relieving pain and discomfort Wear a good, supportive bra if your breasts hurt. Rest with your legs raised if you have leg cramps or low back pain. Safety Wear your seatbelt at all times when you're in a car. Talk to your provider if someone hits you, hurts you, or yells at you. Talk with your provider if you're feeling sad or have thoughts of hurting yourself. Lifestyle Certain things can be harmful while you're pregnant. Follow these rules: Do not use hot tubs, steam rooms, or saunas. Do not douche. Do not use tampons or scented pads. Do not drink alcohol,smoke, vape, or use products with nicotine or tobacco in them. If you need help quitting, talk with your provider. Avoid cat litter boxes and soil used by cats. These things carry germs that can cause harm to your pregnancy and your baby. General instructions Keep all follow-up visits. It helps you and your unborn baby stay as healthy as possible. Write down your questions. Take them to your visits. Your provider will: Talk with you about your overall health. Give you advice or refer you to specialists who can help with different needs, including: Prenatal education classes. Mental health and counseling. Foods and healthy eating. Ask for help if you need help with food. Call your dentist and ask to be seen. Brush your teeth with a soft toothbrush. Floss gently. Where  to find more information American Pregnancy Association: americanpregnancy.org [employer]: acog.org Office on [employer] Health: [employer] Contact a health care  provider if: You feel dizzy, faint, or have a fever. You vomit or have watery poop (diarrhea) for 2 days or more. You have abnormal discharge or bleeding from your vagina. You have pain when you pee or your pee smells bad. You have cramps, pain, or pressure in your belly area. Get help right away if: You have trouble breathing or chest pain. You have any kind of injury, such as from a fall or a car crash. These symptoms may be an emergency. Get help right away. Call 911. Do not wait to see if the symptoms will go away. Do not drive yourself to the hospital. This information is not intended to replace advice given to you by your health care provider. Make sure you discuss any questions you have with your health care provider. Document Revised: 01/11/2023 Document Reviewed: 08/11/2022 Elsevier Patient Education  2024 [employer] Inc.Commonly Asked Questions During Pregnancy  Cats: A parasite can be excreted in cat feces.  To avoid exposure you need to have another person empty the little box.  If you must empty the litter box you will need to wear gloves.  Wash your hands after handling your cat.  This parasite can also be found in raw or undercooked meat so this should also be avoided.  Colds, Sore Throats, Flu: Please check your medication sheet to see what you can take for symptoms.  If your symptoms are unrelieved by these medications please call the office.  Dental Work: Most any dental work Agricultural consultant recommends is permitted.  X-rays should only be taken during the first trimester if absolutely necessary.  Your abdomen should be shielded with a lead apron during all x-rays.  Please notify your provider prior to receiving any x-rays.  Novocaine is fine; gas is not recommended.  If your dentist requires a note from [place] prior to dental work please call the office and we will provide one for you.  Exercise: Exercise is an important part of staying healthy during your pregnancy.  You may continue  most exercises you were accustomed to prior to pregnancy.  Later in your pregnancy you will most likely notice you have difficulty with activities requiring balance like riding a bicycle.  It is important that you listen to your body and avoid activities that put you at a higher risk of falling.  Adequate rest and staying well hydrated are a must!  If you have questions about the safety of specific activities ask your provider.    Exposure to Children with illness: Try to avoid obvious exposure; report any symptoms to [place] when noted,  If you have chicken pos, red measles or mumps, you should be immune to these diseases.   Please do not take any vaccines while pregnant unless you have checked with your OB provider.  Fetal Movement: After 28 weeks we recommend you do "kick counts" twice daily.  Lie or sit down in a calm quiet environment and count your baby movements "kicks".  You should feel your baby at least 10 times per hour.  If you have not felt 10 kicks within the first hour get up, walk around and have something sweet to eat or drink then repeat for an additional hour.  If count remains less than 10 per hour notify your provider.  Fumigating: Follow your pest control agent's advice as to how long  of your home.  Ventilate the area well before re-entering.  Hemorrhoids:   Most over-the-counter preparations can be used during pregnancy.  Check your medication to see what is safe to use.  It is important to use a stool softener or fiber in your diet and to drink lots of liquids.  If hemorrhoids seem to be getting worse please call the office.   Hot Tubs:  Hot tubs Jacuzzis and saunas are not recommended while pregnant.  These increase your internal body temperature and should be avoided.  Intercourse:  Sexual intercourse is safe during pregnancy as long as you are comfortable, unless otherwise advised by your provider.  Spotting may occur after intercourse; report any bright red bleeding that is  heavier than spotting.  Labor:  If you know that you are in labor, please go to the hospital.  If you are unsure, please call the office and let us help you decide what to do.  Lifting, straining, etc:  If your job requires heavy lifting or straining please check with your provider for any limitations.  Generally, you should not lift items heavier than that you can lift simply with your hands and arms (no back muscles)  Painting:  Paint fumes do not harm your pregnancy, but may make you ill and should be avoided if possible.  Latex or water based paints have less odor than oils.  Use adequate ventilation while painting.  Permanents & Hair Color:  Chemicals in hair dyes are not recommended as they cause increase hair dryness which can increase hair loss during pregnancy.  " Highlighting" and permanents are allowed.  Dye may be absorbed differently and permanents may not hold as well during pregnancy.  Sunbathing:  Use a sunscreen, as skin burns easily during pregnancy.  Drink plenty of fluids; avoid over heating.  Tanning Beds:  Because their possible side effects are still unknown, tanning beds are not recommended.  Ultrasound Scans:  Routine ultrasounds are performed at approximately 20 weeks.  You will be able to see your baby's general anatomy an if you would like to know the gender this can usually be determined as well.  If it is questionable when you conceived you may also receive an ultrasound early in your pregnancy for dating purposes.  Otherwise ultrasound exams are not routinely performed unless there is a medical necessity.  Although you can request a scan we ask that you pay for it when conducted because insurance does not cover " patient request" scans.  Work: If your pregnancy proceeds without complications you may work until your due date, unless your physician or employer advises otherwise.  Round Ligament Pain/Pelvic Discomfort:  Sharp, shooting pains not associated with bleeding  are fairly common, usually occurring in the second trimester of pregnancy.  They tend to be worse when standing up or when you remain standing for long periods of time.  These are the result of pressure of certain pelvic ligaments called "round ligaments".  Rest, Tylenol and heat seem to be the most effective relief.  As the womb and fetus grow, they rise out of the pelvis and the discomfort improves.  Please notify the office if your pain seems different than that described.  It may represent a more serious condition.  Common Medications Safe in Pregnancy  Acne:      Constipation:  Benzoyl Peroxide     Colace  Clindamycin      Dulcolax Suppository  Topica Erythromycin     Fibercon  Salicylic Acid  Salicylic Acid      Metamucil         Miralax AVOID:        Senakot   Accutane    Cough:  Retin-A       Cough Drops  Tetracycline      Phenergan w/ Codeine if Rx  Minocycline      Robitussin (Plain & DM)  Antibiotics:     Crabs/Lice:  Ceclor       RID  Cephalosporins    AVOID:  E-Mycins      Kwell  Keflex  Macrobid/Macrodantin   Diarrhea:  Penicillin      Kao-Pectate  Zithromax      Imodium AD         PUSH FLUIDS AVOID:       Cipro     Fever:  Tetracycline      Tylenol (Regular or Extra  Minocycline       Strength)  Levaquin      Extra Strength-Do not          Exceed 8 tabs/24 hrs Caffeine:        200mg /day (equiv. To 1 cup of coffee or  approx. 3 12 oz sodas)         Gas: Cold/Hayfever:       Gas-X  Benadryl      Mylicon  Claritin       Phazyme  **Claritin-D        Chlor-Trimeton    Headaches:  Dimetapp      ASA-Free Excedrin  Drixoral-Non-Drowsy     Cold Compress  Mucinex (Guaifenasin)     Tylenol (Regular or Extra  Sudafed/Sudafed-12 Hour     Strength)  **Sudafed PE Pseudoephedrine   Tylenol Cold & Sinus     Vicks Vapor Rub  Zyrtec  **AVOID if Problems With Blood Pressure         Heartburn: Avoid lying down for at least 1 hour after  meals  Aciphex      Maalox     Rash:  Milk of Magnesia     Benadryl    Mylanta       1% Hydrocortisone Cream  Pepcid  Pepcid Complete   Sleep Aids:  Prevacid      Ambien   Prilosec       Benadryl  Rolaids       Chamomile Tea  Tums (Limit 4/day)     Unisom         Tylenol PM         Warm milk-add vanilla or  Hemorrhoids:       Sugar for taste  Anusol/Anusol H.C.  (RX: Analapram 2.5%)  Sugar Substitutes:  Hydrocortisone OTC     Ok in moderation  Preparation H      Tucks        Vaseline lotion applied to tissue with wiping    Herpes:     Throat:  Acyclovir      Oragel  Famvir  Valtrex     Vaccines:         Flu Shot Leg Cramps:       *Gardasil  Benadryl      Hepatitis A         Hepatitis B Nasal Spray:       Pneumovax  Saline Nasal Spray     Polio Booster         Tetanus Nausea:       Tuberculosis test or PPD  Vitamin  B6 25 mg TID   AVOID:    Dramamine      *Gardasil  Emetrol       Live Poliovirus  Ginger Root 250 mg QID    MMR (measles, mumps &  High Complex Carbs @ Bedtime    rebella)  Sea Bands-Accupressure    Varicella (Chickenpox)  Unisom 1/2 tab TID     *No known complications           If received before Pain:         Known pregnancy;   Darvocet       Resume series after  Lortab        Delivery  Percocet    Yeast:   Tramadol      Femstat  Tylenol 3      Gyne-lotrimin  Ultram       Monistat  Vicodin           MISC:         All Sunscreens           Hair Coloring/highlights          Insect Repellant's          (Including DEET)         Mystic Tans

## 2023-05-24 ENCOUNTER — Ambulatory Visit: Payer: Medicaid Other

## 2023-05-24 DIAGNOSIS — Z3A11 11 weeks gestation of pregnancy: Secondary | ICD-10-CM | POA: Diagnosis not present

## 2023-05-24 DIAGNOSIS — Z3687 Encounter for antenatal screening for uncertain dates: Secondary | ICD-10-CM

## 2023-05-24 DIAGNOSIS — N912 Amenorrhea, unspecified: Secondary | ICD-10-CM

## 2023-05-24 DIAGNOSIS — Z789 Other specified health status: Secondary | ICD-10-CM

## 2023-05-30 ENCOUNTER — Other Ambulatory Visit: Payer: Self-pay

## 2023-05-30 ENCOUNTER — Ambulatory Visit (INDEPENDENT_AMBULATORY_CARE_PROVIDER_SITE_OTHER): Payer: Medicaid Other | Admitting: Dermatology

## 2023-05-30 DIAGNOSIS — L932 Other local lupus erythematosus: Secondary | ICD-10-CM

## 2023-05-30 DIAGNOSIS — Z331 Pregnant state, incidental: Secondary | ICD-10-CM

## 2023-05-30 DIAGNOSIS — L931 Subacute cutaneous lupus erythematosus: Secondary | ICD-10-CM | POA: Diagnosis not present

## 2023-05-30 MED ORDER — TACROLIMUS 0.1 % EX OINT: TOPICAL_OINTMENT | Freq: Two times a day (BID) | CUTANEOUS | 1 refills | Status: DC

## 2023-05-30 NOTE — Progress Notes (Signed)
   Follow-Up Visit   Subjective  Michelle Massey is a 33 y.o. female who presents for the following: Lupus - Pt unable to tolerate Plaquenil  due to lips tingling, blistering, and cracking after taking medication. She is also [redacted] weeks pregnant and unable to take Plaquenil  at this time. Pt currently using Mometasone  0.1% cream to aa's PRN which does seem to help clear lesions.  The following portions of the chart were reviewed this encounter and updated as appropriate: medications, allergies, medical history  Review of Systems:  No other skin or systemic complaints except as noted in HPI or Assessment and Plan.  Objective  Well appearing patient in no apparent distress; mood and affect are within normal limits.   A focused examination was performed of the following areas: the face, arms, and hands   Relevant exam findings are noted in the Assessment and Plan.    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 Condition is improving with treatment but not currently at goal.     Exam: Pink scaly papules on the forehead, jaw, temples, and chest. Bx proven 09/21/2022, 9/24 Labs reviewed all normal, including negative ANA   Treatment Plan: Continue Mometasone  0.1 % cream - apply topically to aa qd/bid  5 days weekly M-F.    Topical steroids (such as triamcinolone, fluocinolone, fluocinonide, mometasone , clobetasol, halobetasol, betamethasone, hydrocortisone) can cause thinning and lightening of the skin if they are used for too long in the same area. Your physician has selected the right strength medicine for your problem and area affected on the body. Please use your medication only as directed by your physician to prevent side effects.   Start Tacrolimus  0.1% ointment to aa's face BID PRN. Patient to check with OB to make sure pregnancy safe before starting.    Avoid extensive sun exposure and smoking. Recommend broad spectrum sunscreens with UVA and UVB, preferable zinc oxide. type. 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 Pt to watch for muscle aches, joint aches, edema, butterfly rash, and severe fatigue should they occur pt to let OB know immediately.  Recommend daily broad spectrum sunscreen SPF 30+ to sun-exposed areas, reapply every 2 hours as needed. Call for new or changing lesions.  Staying in the shade or wearing long sleeves, sun glasses (UVA+UVB protection) and wide brim hats (4-inch brim around the entire circumference of the hat) are also recommended for sun protection.     Return in about 1 year (around 05/29/2024) for Lupus follow up.  Michelle Massey, CMA, am acting as scribe for Rexene Rattler, MD .  Documentation: I have reviewed the above documentation for accuracy and completeness, and I agree with the above.  Rexene Rattler, MD

## 2023-05-30 NOTE — Patient Instructions (Signed)

## 2023-05-30 NOTE — Progress Notes (Signed)
 Pharmacy left message needing updated RX with gram qty and location medication will be used. aw

## 2023-06-04 ENCOUNTER — Encounter: Payer: Medicaid Other | Admitting: Obstetrics

## 2023-06-08 ENCOUNTER — Ambulatory Visit (INDEPENDENT_AMBULATORY_CARE_PROVIDER_SITE_OTHER): Payer: Medicaid Other | Admitting: Licensed Practical Nurse

## 2023-06-08 ENCOUNTER — Other Ambulatory Visit (HOSPITAL_COMMUNITY)
Admission: RE | Admit: 2023-06-08 | Discharge: 2023-06-08 | Disposition: A | Discharge status: Home / Self Care | Payer: Medicaid Other | Source: Ambulatory Visit | Attending: Licensed Practical Nurse | Admitting: Licensed Practical Nurse

## 2023-06-08 VITALS — BP 107/68 | HR 84 | Wt 153.0 lb

## 2023-06-08 DIAGNOSIS — Z3A12 12 weeks gestation of pregnancy: Secondary | ICD-10-CM

## 2023-06-08 DIAGNOSIS — Z113 Encounter for screening for infections with a predominantly sexual mode of transmission: Secondary | ICD-10-CM

## 2023-06-08 DIAGNOSIS — Z0283 Encounter for blood-alcohol and blood-drug test: Secondary | ICD-10-CM

## 2023-06-08 DIAGNOSIS — Z124 Encounter for screening for malignant neoplasm of cervix: Secondary | ICD-10-CM

## 2023-06-08 DIAGNOSIS — Z872 Personal history of diseases of the skin and subcutaneous tissue: Secondary | ICD-10-CM | POA: Insufficient documentation

## 2023-06-08 DIAGNOSIS — Z3481 Encounter for supervision of other normal pregnancy, first trimester: Secondary | ICD-10-CM

## 2023-06-08 DIAGNOSIS — Z3480 Encounter for supervision of other normal pregnancy, unspecified trimester: Secondary | ICD-10-CM | POA: Diagnosis present

## 2023-06-08 DIAGNOSIS — Z1379 Encounter for other screening for genetic and chromosomal anomalies: Secondary | ICD-10-CM

## 2023-06-08 MED ORDER — ASPIRIN 81 MG PO TBEC: 81.0000 mg | DELAYED_RELEASE_TABLET | Freq: Every day | ORAL | Status: DC

## 2023-06-08 NOTE — Progress Notes (Addendum)
NEW OB HISTORY AND PHYSICAL  SUBJECTIVE:       Michelle Massey is a 34 y.o. G32P1001 female, Patient's last menstrual period was 03/11/2023., Estimated Date of Delivery: 12/16/23, [redacted]w[redacted]d, presents today for establishment of Prenatal Care. [place] has a history of SLE and experienced a postpartum hemorrhage with her previous pregnancy. Reports her last SLE flare was after having her son in 2022. She has experienced some mild nausea earlier in pregnancy and mitigated this by having saltine crackers and occasional use of marijuana.      Social history Partner/Relationship: boyfriend; works in [employer], and has other odd jobs.  Living situation: lives with her boyfriend  Work: staying at home with her 45 year old  Exercise: chasing her toddler around Substance use: Marijuana. Has vaped in the past but stopped when she found out she was pregnant. Denies alcohol and cigarette use.  Dental: last visit roughly 2 months ago. In the process of switching dentists.  No cats in home.   Indications for ASA therapy (per uptodate) One of the following: Previous pregnancy with preeclampsia, especially early onset and with an adverse outcome No Multifetal gestation No Chronic hypertension No Type 1 or 2 diabetes mellitus No Chronic kidney disease No Autoimmune disease (antiphospholipid syndrome, systemic lupus erythematosus) Yes  Two or more of the following: Nulliparity No Obesity (body mass index >30 kg/m2) No Family history of preeclampsia in mother or sister No Age >=35 years No Sociodemographic characteristics (African American race, low socioeconomic level) No Personal risk factors (eg, previous pregnancy with low birth weight or small for gestational age infant, previous adverse pregnancy outcome [eg, stillbirth], interval >10 years between pregnancies) No   Gynecologic History Patient's last menstrual period was 03/11/2023.  Contraception: none Last Pap: 2022. Results were: abnormal- ASCUS with High  risk HPV  Obstetric History OB History  Gravida Para Term Preterm AB Living  2 1 1  0 0 1  SAB IAB Ectopic Multiple Live Births  0 0 0 0 1    # Outcome Date GA Lbr Len/2nd Weight Sex Type Anes PTL Lv  2 Current           1 Term 04/26/20 [redacted]w[redacted]d 16:50 / 00:42 3530 g M Vag-Spont EPI  LIV    Past Medical History:  Diagnosis Date   Collagen vascular disease (HCC)    Lupus    Scoliosis     Past Surgical History:  Procedure Laterality Date   NO PAST SURGERIES      Current Outpatient Medications on File Prior to Visit  Medication Sig Dispense Refill   Prenat-Fe Poly-Methfol-FA-DHA (VITAFOL ULTRA) 29-0.6-0.4-200 MG CAPS Take 1 capsule by mouth daily.     mometasone (ELOCON) 0.1 % cream Apply topically to aa qd/bid m - f weekly. (Patient not taking: Reported on 06/08/2023) 45 g 0   tacrolimus (PROTOPIC) 0.1 % ointment Apply topically 2 (two) times daily. Apply 1 gram to aa's lupus BID PRN to areas on face (Patient not taking: Reported on 06/08/2023) 60 g 1   No current facility-administered medications on file prior to visit.    No Known Allergies  Social History   Socioeconomic History   Marital status: Single    Spouse name: Not on file   Number of children: 0   Years of education: 10-11   Highest education level: Not on file  Occupational History   Not on file  Tobacco Use   Smoking status: Former    Current packs/day: 0.00  Average packs/day: 0.5 packs/day for 6.7 years (3.4 ttl pk-yrs)    Types: Cigarettes    Start date: 2013    Quit date: 01/22/2018    Years since quitting: 5.3   Smokeless tobacco: Never  Vaping Use   Vaping status: Never Used  Substance and Sexual Activity   Alcohol use: Not Currently   Drug use: Yes    Types: Marijuana    Comment: with morning sickness   Sexual activity: Yes    Birth control/protection: None  Other Topics Concern   Not on file  Social History Narrative   Not on file   Social Drivers of Health   Financial Resource  Strain: Not on file  Food Insecurity: Not on file  Transportation Needs: Not on file  Physical Activity: Not on file  Stress: Not on file  Social Connections: Not on file  Intimate Partner Violence: Not on file    Family History  Problem Relation Age of Onset   Hodgkin's lymphoma Mother    Hypertension Mother    Cancer Mother    Asthma Mother    Bipolar disorder Sister    Schizophrenia Sister    Diabetes Maternal Great-grandfather    Hypertension Maternal Great-grandfather    Breast cancer Neg Hx    Ovarian cancer Neg Hx    Colon cancer Neg Hx     The following portions of the patient's history were reviewed and updated as appropriate: allergies, current medications, past OB history, past medical history, past surgical history, past family history, past social history, and problem list.  Constitutional: Denied constitutional symptoms, night sweats, recent illness, fatigue, fever, insomnia and weight loss.  Eyes: Denied eye symptoms, eye pain, photophobia, vision change and visual disturbance.  Ears/Nose/Throat/Neck: Denied ear, nose, throat or neck symptoms, hearing loss, nasal discharge, sinus congestion and sore throat.  Cardiovascular: Denied cardiovascular symptoms, arrhythmia, chest pain/pressure, edema, exercise intolerance, orthopnea and palpitations.  Respiratory: Denied pulmonary symptoms, asthma, pleuritic pain, productive sputum, cough, dyspnea and wheezing.  Gastrointestinal: Denied gastro-esophageal reflux, melena, and vomiting.  + mild nausea  Genitourinary: Denied genitourinary symptoms including symptomatic vaginal discharge, pelvic relaxation issues, and urinary complaints.  Musculoskeletal: Denied musculoskeletal symptoms, stiffness, swelling, muscle weakness and myalgia.  Dermatologic: + rash from SLE- seeing dermatology  Neurologic: Denied neurology symptoms, dizziness, headache, neck pain and syncope.  Psychiatric: Denied psychiatric symptoms, anxiety and  depression.  Endocrine: Denied endocrine symptoms including hot flashes and night sweats.     OBJECTIVE: Initial Physical Exam (New OB)  GENERAL APPEARANCE: alert, well appearing, in no apparent distress, oriented to person, place and time HEAD: normocephalic, atraumatic THYROID: no thyromegaly or masses present BREASTS: deferred LUNGS: clear to auscultation, no wheezes, rales or rhonchi, symmetric air entry HEART: regular rate and rhythm, no murmurs ABDOMEN: soft, nontender, nondistended, no abnormal masses, no epigastric pain EXTREMITIES: no redness or tenderness in the calves or thighs SKIN: normal coloration and turgor. Rash noted on face and chest. Seeing dermatology.  LYMPH NODES: no adenopathy palpable NEUROLOGIC: alert, oriented, normal speech, no focal findings or movement disorder noted  PELVIC EXAM EXTERNAL GENITALIA: normal appearing vulva with no masses, tenderness or lesions VAGINA: no abnormal discharge or lesions CERVIX: no lesions or cervical motion tenderness UTERUS: gravid and consistent with 12  weeks 5 days ADNEXA: no masses palpable and nontender  ASSESSMENT: Normal pregnancy   PLAN: Routine prenatal care. We discussed an overview of prenatal care and when to call. Reviewed diet, exercise, and weight gain recommendations in pregnancy.  Discussed cessation of marijuana in pregnancy. Discussed benefits of breastfeeding and lactation resources at [facility]. I reviewed labs and answered all questions.   1. Supervision of other normal pregnancy, antepartum - Sjogrens syndrome-A extractable nuclear antibody - Sjogrens syndrome-B extractable nuclear antibody - Comprehensive metabolic panel - Protein / creatinine ratio, urine - Cytology - PAP - RPR+Rh+ABO+Rub Ab+Ab Scr+CB.Marland Kitchen. - Hepatitis C antibody - Monitor Drug Profile 10(MW) - Nicotine screen, urine - Urine Culture - C3 and C4 - [place] MFM OB COMP + 14 WK; Future - MaterniT21 PLUS Core  2. Hx of discoid lupus  erythematosus - Sjogrens syndrome-A extractable nuclear antibody - Sjogrens syndrome-B extractable nuclear antibody - Comprehensive metabolic panel - Protein / creatinine ratio, urine - Urine Culture - C3 and C4 - [place] MFM OB COMP + 14 WK; Future  3. Cervical cancer screening - Cytology - PAP  4. Screening examination for venereal disease - Cytology - PAP - RPR+Rh+ABO+Rub Ab+Ab Scr+CB.Marland Kitchen. - Hepatitis C antibody  5. Encounter for drug screening - Monitor Drug Profile 10(MW) - Nicotine screen, urine  6. [redacted] weeks gestation of pregnancy - MaterniT21 PLUS Core - Cervicovaginal ancillary only  7. Encounter for other screening for genetic and chromosomal anomalies - MaterniT21 PLUS Core  8. Screening for STD (sexually transmitted disease) (Primary) - Cervicovaginal ancillary only  9. Discussed starting LDASA in setting of SLE/DLE as well as MFM [place] referral.   Eloise Levels, Student-MidWife  Carie Caddy, CNM present for all portions of care.

## 2023-06-09 LAB — COMPREHENSIVE METABOLIC PANEL
ALT: 10 [IU]/L (ref 0–32)
AST: 14 [IU]/L (ref 0–40)
Albumin: 3.8 g/dL — ABNORMAL LOW (ref 3.9–4.9)
Alkaline Phosphatase: 37 [IU]/L — ABNORMAL LOW (ref 44–121)
BUN/Creatinine Ratio: 11 (ref 9–23)
BUN: 6 mg/dL (ref 6–20)
Bilirubin Total: <0.2 mg/dL (ref 0.0–1.2)
CO2: 20 mmol/L (ref 20–29)
Calcium: 8.8 mg/dL (ref 8.7–10.2)
Chloride: 104 mmol/L (ref 96–106)
Creatinine, Ser: 0.54 mg/dL — ABNORMAL LOW (ref 0.57–1.00)
Globulin, Total: 2.1 g/dL (ref 1.5–4.5)
Glucose: 83 mg/dL (ref 70–99)
Potassium: 3.7 mmol/L (ref 3.5–5.2)
Sodium: 138 mmol/L (ref 134–144)
Total Protein: 5.9 g/dL — ABNORMAL LOW (ref 6.0–8.5)
eGFR: 124 mL/min/{1.73_m2} (ref >59)

## 2023-06-09 LAB — RPR+RH+ABO+RUB AB+AB SCR+CB...
Antibody Screen: NEGATIVE
HIV Screen 4th Generation wRfx: NONREACTIVE
Hematocrit: 34.5 % (ref 34.0–46.6)
Hemoglobin: 11.9 g/dL (ref 11.1–15.9)
Hepatitis B Surface Ag: NEGATIVE
MCH: 30.9 pg (ref 26.6–33.0)
MCHC: 34.5 g/dL (ref 31.5–35.7)
MCV: 90 fL (ref 79–97)
Platelets: 235 10*3/uL (ref 150–450)
RBC: 3.85 x10E6/uL (ref 3.77–5.28)
RDW: 12.9 % (ref 11.7–15.4)
RPR Ser Ql: NONREACTIVE
Rh Factor: POSITIVE
Rubella Antibodies, IGG: 3.94 {index} (ref >0.99)
Varicella zoster IgG: REACTIVE
WBC: 7.3 10*3/uL (ref 3.4–10.8)

## 2023-06-09 LAB — PROTEIN / CREATININE RATIO, URINE
Creatinine, Urine: 146.2 mg/dL
Protein, Ur: 11.4 mg/dL
Protein/Creat Ratio: 78 mg/g{creat} (ref 0–200)

## 2023-06-09 LAB — C3 AND C4
Complement C3, Serum: 131 mg/dL (ref 82–167)
Complement C4, Serum: 19 mg/dL (ref 12–38)

## 2023-06-09 LAB — HEPATITIS C ANTIBODY: Hep C Virus Ab: NONREACTIVE

## 2023-06-09 LAB — SJOGRENS SYNDROME-B EXTRACTABLE NUCLEAR ANTIBODY: ENA SSB (LA) Ab: <0.2 AI (ref 0.0–0.9)

## 2023-06-09 LAB — SJOGRENS SYNDROME-A EXTRACTABLE NUCLEAR ANTIBODY: ENA SSA (RO) Ab: 0.2 AI (ref 0.0–0.9)

## 2023-06-11 LAB — NICOTINE SCREEN, URINE: Cotinine Ql Scrn, Ur: POSITIVE ng/mL — AB

## 2023-06-11 LAB — CANNABINOID (GC/MS), URINE
Cannabinoid: POSITIVE — AB
Carboxy THC (GC/MS): >750 ng/mL

## 2023-06-11 LAB — URINE CULTURE

## 2023-06-11 LAB — MONITOR DRUG PROFILE 10(MW)
Amphetamine Scrn, Ur: NEGATIVE ng/mL
BARBITURATE SCREEN URINE: NEGATIVE ng/mL
BENZODIAZEPINE SCREEN, URINE: NEGATIVE ng/mL
Cocaine (Metab) Scrn, Ur: NEGATIVE ng/mL
Creatinine(Crt), U: 135.6 mg/dL (ref 20.0–300.0)
Methadone Screen, Urine: NEGATIVE ng/mL
OXYCODONE+OXYMORPHONE UR QL SCN: NEGATIVE ng/mL
Opiate Scrn, Ur: NEGATIVE ng/mL
Ph of Urine: 5.8 (ref 4.5–8.9)
Phencyclidine Qn, Ur: NEGATIVE ng/mL
Propoxyphene Scrn, Ur: NEGATIVE ng/mL

## 2023-06-12 ENCOUNTER — Encounter: Payer: Self-pay | Admitting: Licensed Practical Nurse

## 2023-06-12 LAB — CERVICOVAGINAL ANCILLARY ONLY
Bacterial Vaginitis (gardnerella): POSITIVE — AB
Candida Glabrata: NEGATIVE
Candida Vaginitis: NEGATIVE
Chlamydia: NEGATIVE
Comment: NEGATIVE
Comment: NEGATIVE
Comment: NEGATIVE
Comment: NEGATIVE
Comment: NEGATIVE
Comment: NORMAL
Neisseria Gonorrhea: NEGATIVE
Trichomonas: NEGATIVE

## 2023-06-13 ENCOUNTER — Other Ambulatory Visit: Payer: Self-pay | Admitting: Licensed Practical Nurse

## 2023-06-13 DIAGNOSIS — N760 Acute vaginitis: Secondary | ICD-10-CM

## 2023-06-13 LAB — CYTOLOGY - PAP
Chlamydia: NEGATIVE
Comment: NEGATIVE
Comment: NEGATIVE
Comment: NEGATIVE
Comment: NEGATIVE
Comment: NORMAL
Diagnosis: HIGH — AB
HPV 16: NEGATIVE
HPV 18 / 45: NEGATIVE
High risk HPV: POSITIVE — AB
Neisseria Gonorrhea: NEGATIVE

## 2023-06-13 MED ORDER — METRONIDAZOLE 500 MG PO TABS
500.0000 mg | ORAL_TABLET | Freq: Two times a day (BID) | ORAL | 0 refills | Status: DC
Start: 2023-06-13 — End: 2023-09-27

## 2023-06-13 NOTE — Progress Notes (Signed)
Pt seen for NOB, swab shows BV, pap with ASC-US H and HPV positive, call to pt to discuss results Script for flagyl sent Pt to call tomorrow to schedule colposcopy Carie Caddy, Ina Homes   [facility]  06/13/23  12:41 PM

## 2023-06-14 NOTE — Addendum Note (Signed)
Addended by: Kathlene Cote on: 06/14/2023 02:03 PM   Modules accepted: Orders

## 2023-06-15 LAB — MATERNIT 21 PLUS CORE, BLOOD
Fetal Fraction: 25
Result (T21): NEGATIVE
Trisomy 13 (Patau syndrome): NEGATIVE
Trisomy 18 (Edwards syndrome): NEGATIVE
Trisomy 21 (Down syndrome): NEGATIVE

## 2023-07-04 DIAGNOSIS — Z8759 Personal history of other complications of pregnancy, childbirth and the puerperium: Secondary | ICD-10-CM | POA: Insufficient documentation

## 2023-07-06 ENCOUNTER — Ambulatory Visit (INDEPENDENT_AMBULATORY_CARE_PROVIDER_SITE_OTHER): Payer: Medicaid Other

## 2023-07-06 VITALS — BP 106/67 | HR 86 | Wt 151.0 lb

## 2023-07-06 DIAGNOSIS — Z8759 Personal history of other complications of pregnancy, childbirth and the puerperium: Secondary | ICD-10-CM

## 2023-07-06 DIAGNOSIS — M329 Systemic lupus erythematosus, unspecified: Secondary | ICD-10-CM

## 2023-07-06 DIAGNOSIS — O0990 Supervision of high risk pregnancy, unspecified, unspecified trimester: Secondary | ICD-10-CM | POA: Diagnosis not present

## 2023-07-06 DIAGNOSIS — O99891 Other specified diseases and conditions complicating pregnancy: Secondary | ICD-10-CM

## 2023-07-06 DIAGNOSIS — R87610 Atypical squamous cells of undetermined significance on cytologic smear of cervix (ASC-US): Secondary | ICD-10-CM

## 2023-07-06 DIAGNOSIS — Z3A16 16 weeks gestation of pregnancy: Secondary | ICD-10-CM | POA: Diagnosis not present

## 2023-07-06 DIAGNOSIS — R87619 Unspecified abnormal cytological findings in specimens from cervix uteri: Secondary | ICD-10-CM | POA: Insufficient documentation

## 2023-07-06 NOTE — Progress Notes (Signed)
    Return Prenatal Note   Assessment/Plan   Plan  33 y.o. G2P1001 at [redacted]w[redacted]d presents for follow-up OB visit. Reviewed prenatal record including previous visit note.  Supervision of high risk pregnancy, antepartum - Anatomy US scheduled for 4/2 with MFM. - Reviewed comfort measures for round ligament pain.  - Reviewed red flag warning signs anticipatory guidance for upcoming prenatal care.   Abnormal Pap smear of cervix - Reviewed need to schedule colpo. Plans to schedule today at check out.   No orders of the defined types were placed in this encounter.  Return in about 4 weeks (around 08/03/2023) for ROB.   Future Appointments  Date Time Provider Department Center  07/25/2023  8:00 AM [facility]  02/28/2024  9:00 AM Scoggins, Joice Lofts, NP [facility] None  06/02/2024  9:00 AM Willeen Niece, MD [facility] None    For next visit:  continue with routine prenatal care, needs colpo     Subjective   33 y.o. G2P1001 at [redacted]w[redacted]d presents for this follow-up prenatal visit.  Patient has been experiencing more lower abdominal pain and discomfort.  Patient reports: Movement: Absent Contractions: Not present  Objective   Flow sheet Vitals: Pulse Rate: 86 BP: 106/67 Fundal Height: 16 cm Fetal Heart Rate (bpm): 145 Total weight gain: 3 lb (1.361 kg)  General Appearance  No acute distress, well appearing, and well nourished Pulmonary   Normal work of breathing Neurologic   Alert and oriented to person, place, and time Psychiatric   Mood and affect within normal limits  Michelle Massey, CNM  03/14/258:37 AM

## 2023-07-06 NOTE — Assessment & Plan Note (Signed)
-   Anatomy US scheduled for 4/2 with MFM. - Reviewed comfort measures for round ligament pain.  - Reviewed red flag warning signs anticipatory guidance for upcoming prenatal care.

## 2023-07-06 NOTE — Assessment & Plan Note (Addendum)
-   Reviewed need to schedule colpo. Plans to schedule today at check out.

## 2023-07-23 ENCOUNTER — Telehealth (HOSPITAL_BASED_OUTPATIENT_CLINIC_OR_DEPARTMENT_OTHER)

## 2023-07-23 DIAGNOSIS — Z3A19 19 weeks gestation of pregnancy: Secondary | ICD-10-CM

## 2023-07-23 NOTE — Progress Notes (Signed)
 New OB Intake  I connected with Rollene Fare  on 07/23/23 at  8:30 AM EDT by phone and verified that I am speaking with the correct person using two identifiers. Nurse is located at [facility] and pt is located at home.   I explained I am completing New Patient Intake today. We discussed EDD of 12/16/2023, by Last Menstrual Period. Pt is G2P1001. I reviewed her allergies, medications and Medical/Surgical/OB history.    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   All questions were answered.

## 2023-07-25 ENCOUNTER — Other Ambulatory Visit: Payer: Self-pay

## 2023-07-25 ENCOUNTER — Ambulatory Visit: Payer: Medicaid Other | Attending: Maternal & Fetal Medicine

## 2023-07-25 VITALS — BP 118/67 | HR 79 | Temp 98.0°F

## 2023-07-25 DIAGNOSIS — M329 Systemic lupus erythematosus, unspecified: Secondary | ICD-10-CM | POA: Diagnosis not present

## 2023-07-25 DIAGNOSIS — Z872 Personal history of diseases of the skin and subcutaneous tissue: Secondary | ICD-10-CM | POA: Insufficient documentation

## 2023-07-25 DIAGNOSIS — O26892 Other specified pregnancy related conditions, second trimester: Secondary | ICD-10-CM | POA: Diagnosis not present

## 2023-07-25 DIAGNOSIS — O09292 Supervision of pregnancy with other poor reproductive or obstetric history, second trimester: Secondary | ICD-10-CM | POA: Insufficient documentation

## 2023-07-25 DIAGNOSIS — Z3A19 19 weeks gestation of pregnancy: Secondary | ICD-10-CM | POA: Insufficient documentation

## 2023-07-25 DIAGNOSIS — Z362 Encounter for other antenatal screening follow-up: Secondary | ICD-10-CM

## 2023-07-25 DIAGNOSIS — O0990 Supervision of high risk pregnancy, unspecified, unspecified trimester: Secondary | ICD-10-CM

## 2023-07-25 DIAGNOSIS — Z363 Encounter for antenatal screening for malformations: Secondary | ICD-10-CM | POA: Insufficient documentation

## 2023-07-25 DIAGNOSIS — R1031 Right lower quadrant pain: Secondary | ICD-10-CM

## 2023-08-03 ENCOUNTER — Ambulatory Visit (INDEPENDENT_AMBULATORY_CARE_PROVIDER_SITE_OTHER): Admitting: Certified Nurse Midwife

## 2023-08-03 VITALS — BP 103/66 | HR 73 | Wt 158.5 lb

## 2023-08-03 DIAGNOSIS — O0990 Supervision of high risk pregnancy, unspecified, unspecified trimester: Secondary | ICD-10-CM | POA: Diagnosis not present

## 2023-08-03 DIAGNOSIS — Z3A20 20 weeks gestation of pregnancy: Secondary | ICD-10-CM

## 2023-08-03 DIAGNOSIS — O0992 Supervision of high risk pregnancy, unspecified, second trimester: Secondary | ICD-10-CM | POA: Diagnosis not present

## 2023-08-03 MED ORDER — ONDANSETRON 4 MG PO TBDP: 4.0000 mg | ORAL_TABLET | Freq: Three times a day (TID) | ORAL | 0 refills | Status: DC | PRN

## 2023-08-03 NOTE — Progress Notes (Signed)
    Return Prenatal Note   Assessment/Plan   Plan  33 y.o. G2P1001 at [redacted]w[redacted]d presents for follow-up OB visit. Reviewed prenatal record including previous visit note.  Supervision of high risk pregnancy, antepartum Reviewed anatomy US. Will need follow up to complete views. Scheduled with MFM 4/30 Still struggling with nausea in the morning. Will send zofran. Reviewed red flag warning signs anticipatory guidance for upcoming prenatal care.     No orders of the defined types were placed in this encounter.  No follow-ups on file.   Future Appointments  Date Time Provider Department Center  08/22/2023  8:00 AM [facility]  08/31/2023  8:15 AM Free, Lindalou Hose, CNM [facility] None  02/28/2024  9:00 AM Scoggins, Joice Lofts, NP [facility] None  06/02/2024  9:00 AM Willeen Niece, MD [facility] None    For next visit:  continue with routine prenatal care     Subjective   34 y.o. G2P1001 at [redacted]w[redacted]d presents for this follow-up prenatal visit.  Patient reports continued nausea in the mornings. Able to eat and drink by the afternoon.  Patient reports: Movement: Present Contractions: Not present  Objective   Flow sheet Vitals: Pulse Rate: 73 BP: 103/66 Fundal Height: 20 cm Fetal Heart Rate (bpm): 136 Total weight gain: 10 lb 8 oz (4.763 kg)  General Appearance  No acute distress, well appearing, and well nourished Pulmonary   Normal work of breathing Neurologic   Alert and oriented to person, place, and time Psychiatric   Mood and affect within normal limits  Oley Balm, CNM  04/11/259:08 AM

## 2023-08-03 NOTE — Assessment & Plan Note (Signed)
 Reviewed anatomy US. Will need follow up to complete views. Scheduled with MFM 4/30 Still struggling with nausea in the morning. Will send zofran. Reviewed red flag warning signs anticipatory guidance for upcoming prenatal care.

## 2023-08-22 ENCOUNTER — Other Ambulatory Visit: Payer: Self-pay

## 2023-08-22 ENCOUNTER — Ambulatory Visit: Attending: Maternal & Fetal Medicine

## 2023-08-22 VITALS — BP 113/62 | HR 86

## 2023-08-22 DIAGNOSIS — O99891 Other specified diseases and conditions complicating pregnancy: Secondary | ICD-10-CM | POA: Diagnosis not present

## 2023-08-22 DIAGNOSIS — Z3A23 23 weeks gestation of pregnancy: Secondary | ICD-10-CM | POA: Diagnosis not present

## 2023-08-22 DIAGNOSIS — O09292 Supervision of pregnancy with other poor reproductive or obstetric history, second trimester: Secondary | ICD-10-CM | POA: Diagnosis not present

## 2023-08-22 DIAGNOSIS — M329 Systemic lupus erythematosus, unspecified: Secondary | ICD-10-CM | POA: Insufficient documentation

## 2023-08-22 DIAGNOSIS — Z362 Encounter for other antenatal screening follow-up: Secondary | ICD-10-CM | POA: Insufficient documentation

## 2023-08-22 DIAGNOSIS — O26892 Other specified pregnancy related conditions, second trimester: Secondary | ICD-10-CM | POA: Diagnosis not present

## 2023-08-22 DIAGNOSIS — O0990 Supervision of high risk pregnancy, unspecified, unspecified trimester: Secondary | ICD-10-CM

## 2023-08-31 ENCOUNTER — Ambulatory Visit (INDEPENDENT_AMBULATORY_CARE_PROVIDER_SITE_OTHER)

## 2023-08-31 VITALS — BP 132/82 | HR 108 | Wt 163.0 lb

## 2023-08-31 DIAGNOSIS — Z113 Encounter for screening for infections with a predominantly sexual mode of transmission: Secondary | ICD-10-CM | POA: Diagnosis not present

## 2023-08-31 DIAGNOSIS — O0990 Supervision of high risk pregnancy, unspecified, unspecified trimester: Secondary | ICD-10-CM

## 2023-08-31 DIAGNOSIS — O99891 Other specified diseases and conditions complicating pregnancy: Secondary | ICD-10-CM

## 2023-08-31 DIAGNOSIS — Z131 Encounter for screening for diabetes mellitus: Secondary | ICD-10-CM

## 2023-08-31 DIAGNOSIS — D649 Anemia, unspecified: Secondary | ICD-10-CM | POA: Diagnosis not present

## 2023-08-31 DIAGNOSIS — M329 Systemic lupus erythematosus, unspecified: Secondary | ICD-10-CM

## 2023-08-31 DIAGNOSIS — Z3A24 24 weeks gestation of pregnancy: Secondary | ICD-10-CM

## 2023-08-31 NOTE — Assessment & Plan Note (Signed)
 Doing well today with no concerns, reports nausea is better with use of Zofran .  Reports good FM and no ctx, VB,or LOF Discussed plan for T3 labs and GTT at next visit and continuing monitoring with MFM.

## 2023-08-31 NOTE — Progress Notes (Signed)
    Return Prenatal Note   Assessment/Plan   Plan  33 y.o. G2P1001 at 107w5d presents for follow-up OB visit. Reviewed prenatal record including previous visit note.  Systemic lupus complicating pregnancy (HCC) MFM US  4/30: Anatomy completed, Est. FW: 682 gm 1 lb 8 oz 82 %  Continue growths Q4.   Supervision of high risk pregnancy, antepartum Doing well today with no concerns, reports nausea is better with use of Zofran .  Reports good FM and no ctx, VB,or LOF Discussed plan for T3 labs and GTT at next visit and continuing monitoring with MFM.    Orders Placed This Encounter  Procedures   28 Week RH+Panel    Standing Status:   Future    Expected Date:   09/14/2023    Expiration Date:   08/30/2024   Return in about 4 weeks (around 09/28/2023) for ROB with 1 hour glucola.   Future Appointments  Date Time Provider Department Center  09/26/2023  2:00 PM [facility]  09/28/2023  8:20 AM [facility] None  09/28/2023  8:55 AM Forestine Igo, CNM [facility]-AOB None  02/28/2024  9:00 AM Scoggins, Ennis Hart, NP [facility] None  06/02/2024  9:00 AM Artemio Larry, MD [facility] None    For next visit:  ROB with 1 hour gluocla, third trimester labs, and Tdap     Subjective   33 y.o. G2P1001 at [redacted]w[redacted]d presents for this follow-up prenatal visit.  Patient doing well today with no concerns. Reports some relief from nausea using Zofran .  Patient reports: Movement: Present Contractions: Not present  Objective   Flow sheet Vitals: Pulse Rate: (!) 108 BP: 132/82 Fundal Height: 25 cm Fetal Heart Rate (bpm): 145 Total weight gain: 6.804 kg  General Appearance  No acute distress, well appearing, and well nourished Pulmonary   Normal work of breathing Neurologic   Alert and oriented to person, place, and time Psychiatric   Mood and affect within normal limits  [place] Calixto Pavel, Student-MidWife  05/09/259:07 AM

## 2023-08-31 NOTE — Assessment & Plan Note (Signed)
 MFM US  4/30: Anatomy completed, Est. FW: 682 gm 1 lb 8 oz 82 %  Continue growths Q4.

## 2023-09-26 ENCOUNTER — Other Ambulatory Visit: Payer: Self-pay

## 2023-09-26 ENCOUNTER — Ambulatory Visit: Attending: Obstetrics

## 2023-09-26 VITALS — BP 113/68 | HR 104

## 2023-09-26 DIAGNOSIS — O09293 Supervision of pregnancy with other poor reproductive or obstetric history, third trimester: Secondary | ICD-10-CM | POA: Diagnosis present

## 2023-09-26 DIAGNOSIS — O26893 Other specified pregnancy related conditions, third trimester: Secondary | ICD-10-CM | POA: Insufficient documentation

## 2023-09-26 DIAGNOSIS — M329 Systemic lupus erythematosus, unspecified: Secondary | ICD-10-CM | POA: Diagnosis not present

## 2023-09-26 DIAGNOSIS — O99891 Other specified diseases and conditions complicating pregnancy: Secondary | ICD-10-CM

## 2023-09-26 DIAGNOSIS — O0990 Supervision of high risk pregnancy, unspecified, unspecified trimester: Secondary | ICD-10-CM

## 2023-09-26 DIAGNOSIS — Z362 Encounter for other antenatal screening follow-up: Secondary | ICD-10-CM | POA: Insufficient documentation

## 2023-09-26 DIAGNOSIS — Z3A28 28 weeks gestation of pregnancy: Secondary | ICD-10-CM

## 2023-09-28 ENCOUNTER — Encounter: Payer: Self-pay | Admitting: Cardiology

## 2023-09-28 ENCOUNTER — Other Ambulatory Visit

## 2023-09-28 ENCOUNTER — Ambulatory Visit: Admitting: Certified Nurse Midwife

## 2023-09-28 VITALS — BP 97/62 | HR 87 | Wt 169.7 lb

## 2023-09-28 DIAGNOSIS — Z131 Encounter for screening for diabetes mellitus: Secondary | ICD-10-CM

## 2023-09-28 DIAGNOSIS — Z23 Encounter for immunization: Secondary | ICD-10-CM | POA: Diagnosis not present

## 2023-09-28 DIAGNOSIS — M329 Systemic lupus erythematosus, unspecified: Secondary | ICD-10-CM

## 2023-09-28 DIAGNOSIS — Z113 Encounter for screening for infections with a predominantly sexual mode of transmission: Secondary | ICD-10-CM

## 2023-09-28 DIAGNOSIS — Z3A28 28 weeks gestation of pregnancy: Secondary | ICD-10-CM | POA: Diagnosis not present

## 2023-09-28 DIAGNOSIS — O99891 Other specified diseases and conditions complicating pregnancy: Secondary | ICD-10-CM

## 2023-09-28 DIAGNOSIS — O0990 Supervision of high risk pregnancy, unspecified, unspecified trimester: Secondary | ICD-10-CM

## 2023-09-28 DIAGNOSIS — Z3A24 24 weeks gestation of pregnancy: Secondary | ICD-10-CM

## 2023-09-28 DIAGNOSIS — D649 Anemia, unspecified: Secondary | ICD-10-CM

## 2023-09-28 NOTE — Assessment & Plan Note (Signed)
 Reviewed kick counts and preterm labor warning signs. Instructed to call office or come to hospital with persistent headache, vision changes, regular contractions, leaking of fluid, decreased fetal movement or vaginal bleeding.

## 2023-09-28 NOTE — Assessment & Plan Note (Signed)
 MFM ultrasound 6/4 with EFW 1416 gm 3 lb 2 oz 80 %, monthly growth recommended, 32w ultrasound ordered.

## 2023-09-28 NOTE — Patient Instructions (Signed)
 Third Trimester of Pregnancy  The third trimester of pregnancy is from week 28 through week 40. This is months 7 through 9. The third trimester is a time when your baby is growing fast. Body changes during your third trimester Your body continues to change during this time. The changes usually go away after your baby is born. Physical changes You will continue to gain weight. You may get stretch marks on your hips, belly, and breasts. Your breasts will keep growing and may hurt. A yellow fluid (colostrum) may leak from your breasts. This is the first milk you're making for your baby. Your hair may grow faster and get thicker. In some cases, you may get hair loss. Your belly button may stick out. You may have more swelling in your hands, face, or ankles. Health changes You may have heartburn. You may feel short of breath. This is caused by the uterus that is now bigger. You may have more aches in the pelvis, back, or thighs. You may have more tingling or numbness in your hands, arms, and legs. You may pee more often. You may have trouble pooping (constipation) or swollen veins in the butt that can itch or get painful (hemorrhoids). Other changes You may have more problems sleeping. You may notice the baby moving lower in your belly (dropping). You may have more fluid coming from your vagina. Your joints may feel loose, and you may have pain around your pelvic bone. Follow these instructions at home: Medicines Take medicines only as told by your health care provider. Some medicines are not safe during pregnancy. Your provider may change the medicines that you take. Do not take any medicines unless told to by your provider. Take a prenatal vitamin that has at least 600 micrograms (mcg) of folic acid. Do not use herbal medicines, illegal drugs, or medicines that are not approved by your provider. Eating and drinking While you're pregnant your body needs additional nutrition to help  support your growing baby. Talk with your provider about your nutritional needs. Activity Most women are able to exercise regularly during pregnancy. Exercise routines may need to change at the end of your pregnancy. Talk to your provider about your activities and exercise routine. Relieving pain and discomfort Rest often with your legs raised if you have leg cramps or low back pain. Take warm sitz baths to soothe pain from hemorrhoids. Use hemorrhoid cream if your provider says it's okay. Wear a good, supportive bra if your breasts hurt. Do not use hot tubs, steam rooms, or saunas. Do not douche. Do not use tampons or scented pads. Safety Talk to your provider before traveling far distances. Wear your seatbelt at all times when you're in a car. Talk to your provider if someone hits you, hurts you, or yells at you. Preparing for birth To prepare for your baby: Take childbirth and breastfeeding classes. Visit the hospital and tour the maternity area. Buy a rear-facing car seat. Learn how to install it in your car. General instructions Avoid cat litter boxes and soil used by cats. These things carry germs that can cause harm to your pregnancy and your baby. Do not drink alcohol, smoke, vape, or use products with nicotine or tobacco in them. If you need help quitting, talk with your provider. Keep all follow-up visits for your third trimester. Your provider will do more exams and tests during this trimester. Write down your questions. Take them to your prenatal visits. Your provider also will: Talk with you about  your overall health. Give you advice or refer you to specialists who can help with different needs, including: Mental health and counseling. Foods and healthy eating. Ask for help if you need help with food. Where to find more information American Pregnancy Association: americanpregnancy.org [employer]ians [employer] Gynecologists: acog.org Office on [employer] Health:  [employer] Contact a health care provider if: You have a headache that does not go away when you take medicine. You have any of these problems: You can't eat or drink. You have nausea and vomiting. You have watery poop (diarrhea) for 2 days or more. You have pain when you pee, or your pee smells bad. You have been sick for 2 days or more and aren't getting better. Contact your provider right away if: You have any of these coming from your vagina: Abnormal discharge. Bad-smelling fluid. Bleeding. Your baby is moving less than usual. You have signs of labor: You have any contractions, belly cramping, or have pain in your pelvis or lower back before 37 weeks of pregnancy (preterm labor). You have regular contractions that are less than 5 minutes apart. Your water breaks. You have symptoms of high blood pressure or preeclampsia. These include: A severe, throbbing headache that does not go away. Sudden or extreme swelling of your face, hands, legs, or feet. Vision problems: You see spots. You have blurry vision. Your eyes are sensitive to light. If you can't reach your provider, go to an urgent care or emergency room. Get help right away if: You faint, become confused, or can't think clearly. You have chest pain or trouble breathing. You have any kind of injury, such as from a fall or a car crash. These symptoms may be an emergency. Call 911 right away. Do not wait to see if the symptoms will go away. Do not drive yourself to the hospital. This information is not intended to replace advice given to you by your health care provider. Make sure you discuss any questions you have with your health care provider. Document Revised: 01/11/2023 Document Reviewed: 08/11/2022 Elsevier Patient Education  2024 [employer].

## 2023-09-28 NOTE — Progress Notes (Signed)
    Return Prenatal Note   Subjective   33 y.o. G2P1001 at [redacted]w[redacted]d presents for this follow-up prenatal visit.  Patient reports inner thigh/groin pain, worse with getting up & down while caring for 33yo, some relief with massage from partner. Patient reports: Movement: Present Contractions: Not present  Objective   Flow sheet Vitals: Pulse Rate: 87 BP: 97/62 Fundal Height: 30 cm Fetal Heart Rate (bpm): 130 Total weight gain: 21 lb 11.2 oz (9.843 kg)  General Appearance  No acute distress, well appearing, and well nourished Pulmonary   Normal work of breathing Neurologic   Alert and oriented to person, place, and time Psychiatric   Mood and affect within normal limits  Assessment/Plan   Plan  33 y.o. G2P1001 at [redacted]w[redacted]d presents for follow-up OB visit. Reviewed prenatal record including previous visit note.  Systemic lupus complicating pregnancy (HCC) MFM ultrasound 6/4 with EFW 1416 gm 3 lb 2 oz 80 %, monthly growth recommended, 32w ultrasound ordered.  Supervision of high risk pregnancy, antepartum Reviewed kick counts and preterm labor warning signs. Instructed to call office or come to hospital with persistent headache, vision changes, regular contractions, leaking of fluid, decreased fetal movement or vaginal bleeding.   Consider abdominal support belt to help reduce BLE discomfort. Will follow up on MFM recommendation for weekly testing beginning at 32w. Growth ultrasound ordered.   Orders Placed This Encounter  Procedures   US  OB Follow Up    Standing Status:   Future    Expected Date:   10/28/2023    Expiration Date:   12/29/2023    Reason for exam::   follow up growth, lupus    Preferred imaging location?:   Internal   Tdap vaccine greater than or equal to 7yo IM   Return in 2 weeks (on 10/12/2023) for ROB.   Future Appointments  Date Time Provider Department Center  10/12/2023  3:15 PM Alise Appl, CNM [facility] None  10/24/2023  1:15 PM AOB-NST ROOM [facility] None   10/24/2023  2:00 PM [facility] US  1 AOB-IMG None  10/24/2023  3:15 PM Phylliss Brenner, CNM [facility] None  02/28/2024  9:00 AM Scoggins, Ennis Hart, NP [facility] None  06/02/2024  9:00 AM Artemio Larry, MD [facility] None    For next visit:  continue with routine prenatal care     Forestine Igo, CNM  06/06/259:39 AM

## 2023-09-29 LAB — 28 WEEK RH+PANEL
Basophils Absolute: 0 10*3/uL (ref 0.0–0.2)
Basos: 0 %
EOS (ABSOLUTE): 0 10*3/uL (ref 0.0–0.4)
Eos: 0 %
Gestational Diabetes Screen: 161 mg/dL — ABNORMAL HIGH (ref 70–139)
HIV Screen 4th Generation wRfx: NONREACTIVE
Hematocrit: 33.6 % — ABNORMAL LOW (ref 34.0–46.6)
Hemoglobin: 10.8 g/dL — ABNORMAL LOW (ref 11.1–15.9)
Immature Grans (Abs): 0 10*3/uL (ref 0.0–0.1)
Immature Granulocytes: 0 %
Lymphocytes Absolute: 1.7 10*3/uL (ref 0.7–3.1)
Lymphs: 19 %
MCH: 29.3 pg (ref 26.6–33.0)
MCHC: 32.1 g/dL (ref 31.5–35.7)
MCV: 91 fL (ref 79–97)
Monocytes Absolute: 0.3 10*3/uL (ref 0.1–0.9)
Monocytes: 4 %
Neutrophils Absolute: 6.8 10*3/uL (ref 1.4–7.0)
Neutrophils: 77 %
Platelets: 254 10*3/uL (ref 150–450)
RBC: 3.69 x10E6/uL — ABNORMAL LOW (ref 3.77–5.28)
RDW: 12.6 % (ref 11.7–15.4)
RPR Ser Ql: NONREACTIVE
WBC: 8.8 10*3/uL (ref 3.4–10.8)

## 2023-10-01 ENCOUNTER — Ambulatory Visit: Payer: Self-pay

## 2023-10-01 DIAGNOSIS — O99019 Anemia complicating pregnancy, unspecified trimester: Secondary | ICD-10-CM | POA: Insufficient documentation

## 2023-10-01 DIAGNOSIS — O99013 Anemia complicating pregnancy, third trimester: Secondary | ICD-10-CM

## 2023-10-01 DIAGNOSIS — O0990 Supervision of high risk pregnancy, unspecified, unspecified trimester: Secondary | ICD-10-CM

## 2023-10-01 MED ORDER — ACCRUFER 30 MG PO CAPS: 1.0000 | ORAL_CAPSULE | Freq: Two times a day (BID) | ORAL | 6 refills | Status: AC

## 2023-10-05 ENCOUNTER — Other Ambulatory Visit

## 2023-10-05 ENCOUNTER — Other Ambulatory Visit: Payer: Self-pay

## 2023-10-05 DIAGNOSIS — O0990 Supervision of high risk pregnancy, unspecified, unspecified trimester: Secondary | ICD-10-CM

## 2023-10-05 DIAGNOSIS — Z131 Encounter for screening for diabetes mellitus: Secondary | ICD-10-CM

## 2023-10-05 NOTE — Addendum Note (Signed)
 Addended by: Abram Abraham on: 10/05/2023 08:30 AM   Modules accepted: Orders

## 2023-10-06 LAB — GESTATIONAL GLUCOSE TOLERANCE
Glucose, Fasting: 78 mg/dL (ref 70–94)
Glucose, GTT - 1 Hour: 131 mg/dL (ref 70–179)
Glucose, GTT - 2 Hour: 141 mg/dL (ref 70–154)
Glucose, GTT - 3 Hour: 123 mg/dL (ref 70–139)

## 2023-10-09 ENCOUNTER — Ambulatory Visit: Payer: Self-pay

## 2023-10-12 ENCOUNTER — Ambulatory Visit (INDEPENDENT_AMBULATORY_CARE_PROVIDER_SITE_OTHER): Admitting: Certified Nurse Midwife

## 2023-10-12 ENCOUNTER — Encounter: Payer: Self-pay | Admitting: Certified Nurse Midwife

## 2023-10-12 VITALS — BP 108/70 | HR 87 | Wt 173.3 lb

## 2023-10-12 DIAGNOSIS — O0993 Supervision of high risk pregnancy, unspecified, third trimester: Secondary | ICD-10-CM | POA: Diagnosis not present

## 2023-10-12 DIAGNOSIS — Z3A30 30 weeks gestation of pregnancy: Secondary | ICD-10-CM | POA: Diagnosis not present

## 2023-10-12 NOTE — Patient Instructions (Signed)
 Early Labor (Pre-Term Labor): What to Know Pregnancy normally lasts 39-41 weeks. Pre-term labor is when labor starts before you have been pregnant for 37 weeks. Babies who are born too early may have a higher risk for long-term problems like cerebral palsy or developmental delays. Premature babies may also have problems soon after birth, such as problems with their blood sugar, body temperature, heart, and breathing. These babies often have trouble with feeding.These problems may be very serious in babies who are born before 34 weeks of pregnancy. What are the causes? The cause of pre-term labor is not known. What increases the risk? You're more likely to have pre-term labor if: You have medical problems, now or in the past. You have problems now or in your past pregnancies. You have risk factors related to lifestyle, environment, and age. Medical history You have problems affecting your uterus, including a short cervix. You have a sexually transmitted infections (STI) or other infections of the urinary tract and the vagina. You have: Blood clotting problems. High blood pressure. High blood sugar. You have a low body weight or too much body weight. Present and past pregnancies You have had pre-term labor before. You're pregnant with more than one baby. The placenta covers your cervix,. This is called placenta previa. Your unborn baby has a congenital condition. This means your baby will be born with the condition. You have bleeding from your vagina while you're pregnant. You became pregnant by a method called IVF. Lifestyle and environmental factors You smoke. You drink alcohol. You use drugs. You have stress and no social support. You have abuse in your home. You're exposed to certain chemicals at home or work. Other factors You're younger than age 63 or older than age 20. What are the signs or symptoms? Cramps like those that can happen during a menstrual period. The cramps may  happen with diarrhea, which is watery poop. Pain in the belly. Pain in the lower back. Regular contractions. It may feel like your belly is getting tighter. Pressure in your pelvis. More fluid leaking from the vagina. The fluid may be watery or bloody. Your water breaking. How is this treated? Treatment depends on your health, the health of your baby, and how far along your pregnancy is. It may include: Taking: Medicines to stop contractions. Medicines to help the baby's lungs mature. Medicines to help prevent your baby from having cerebral palsy or other problems. Bed rest. If the labor happens before 34 weeks of pregnancy, you may need to stay in the hospital. Delivering the baby. Follow these instructions at home: Do not smoke, vape, or use nicotine or tobacco. Do not drink alcohol. Take your medicines only as told. Rest as told by your doctor. Ask what things are safe for you to do at home. Ask when you can go back to work or school. Keep all follow-up visits. Your provider will need to closely follow your health and the health of your baby. How is this prevented? To increase your chance of having a full-term pregnancy: Do not use drugs. Do not use any medicines unless you ask your doctor if they are safe for you. Talk with your doctor before taking any herbal supplements. Gain a healthy amount of weight during your pregnancy. Watch for infection. If you think you might have an infection, get it checked right away. Symptoms of infection may include: Fever. Discharge from your vagina that smells bad or is not normal. Pain or burning when you pee. Having to pee  small amounts or very often. Blood in your pee. Where to find more information To learn more, go to these websites: U.S. Department of Health and Cytogeneticist on Women's Health: http://hoffman.com/ [employer]: www.acog.org Centers for Disease Control and Prevention  at DiningCalendar.de. Then: Click "Search" and type "preterm labor." Find the link you need. Contact a doctor if: You think you're going into pre-term labor. You have symptoms of pre-term labor. You have symptoms of infection. Get help right away if: You're having painful contractions every 5 minutes or less. Your water breaks. This information is not intended to replace advice given to you by your health care provider. Make sure you discuss any questions you have with your health care provider. Document Revised: 10/19/2022 Document Reviewed: 10/19/2022 Elsevier Patient Education  2024 [employer].

## 2023-10-12 NOTE — Progress Notes (Signed)
 ROB doing well, feeling good movement. Denies questions or concernst today. Has follow up MFM appointment at the beginning of the month.  Return 2 weeks for ROB.   Alise Appl, CNM

## 2023-10-24 ENCOUNTER — Encounter: Payer: Self-pay | Admitting: Obstetrics

## 2023-10-24 ENCOUNTER — Ambulatory Visit (INDEPENDENT_AMBULATORY_CARE_PROVIDER_SITE_OTHER): Admitting: Obstetrics

## 2023-10-24 ENCOUNTER — Ambulatory Visit

## 2023-10-24 ENCOUNTER — Other Ambulatory Visit

## 2023-10-24 ENCOUNTER — Other Ambulatory Visit: Payer: Self-pay

## 2023-10-24 VITALS — BP 110/68 | HR 85 | Wt 173.0 lb

## 2023-10-24 DIAGNOSIS — M329 Systemic lupus erythematosus, unspecified: Secondary | ICD-10-CM

## 2023-10-24 DIAGNOSIS — Z3A35 35 weeks gestation of pregnancy: Secondary | ICD-10-CM

## 2023-10-24 DIAGNOSIS — O99891 Other specified diseases and conditions complicating pregnancy: Secondary | ICD-10-CM | POA: Diagnosis not present

## 2023-10-24 DIAGNOSIS — Z3A32 32 weeks gestation of pregnancy: Secondary | ICD-10-CM

## 2023-10-24 DIAGNOSIS — Z3A33 33 weeks gestation of pregnancy: Secondary | ICD-10-CM

## 2023-10-24 DIAGNOSIS — O0990 Supervision of high risk pregnancy, unspecified, unspecified trimester: Secondary | ICD-10-CM

## 2023-10-24 NOTE — Progress Notes (Addendum)
    Return Prenatal Note   Assessment/Plan   Plan  33 y.o. G2P1001 at [redacted]w[redacted]d presents for follow-up OB visit. Reviewed prenatal record including previous visit note.  Supervision of high risk pregnancy, antepartum - Anticipatory guidance reviewed.  - Reviewed kick counts and preterm labor warning signs. Instructed to call office or come to hospital with persistent headache, vision changes, regular contractions, leaking of fluid, decreased fetal movement or vaginal bleeding.    Systemic lupus complicating pregnancy (HCC) -BPP 8/8 today. - NST at next visit.     No orders of the defined types were placed in this encounter.  No follow-ups on file.   Future Appointments  Date Time Provider Department Center  10/30/2023  8:15 AM AOB-NST ROOM [facility] None  11/06/2023 10:15 AM AOB-NST ROOM [facility] None  11/06/2023 10:35 AM Leigh Sober, MD [facility] None  11/13/2023  9:45 AM AOB-NST ROOM [facility] None  11/13/2023 10:15 AM Dominic, Jinnie Jansky, CNM [facility] None  11/20/2023 10:15 AM AOB-NST ROOM [facility] None  11/20/2023 10:55 AM Dominic, Jinnie Jansky, CNM [facility] None  11/27/2023 10:15 AM AOB-NST ROOM [facility] None  11/27/2023 10:35 AM Slaughterbeck, Damien, CNM [facility] None  02/28/2024  9:00 AM Scoggins, Jeoffrey, NP AMA-AMA None  06/02/2024  9:00 AM Jackquline Sawyer, MD [facility] None    For next visit:  Routine prenatal care with NST     Subjective  Michelle Massey is feeling well overall, though has had more difficulty picking up her toddler.   Movement: Present Contractions: Irritability  Objective   Flow sheet Vitals: Pulse Rate: 85 BP: 110/68 Fundal Height: 33 cm Fetal Heart Rate (bpm): 150 Total weight gain: 11.3 kg  General Appearance  No acute distress, well appearing, and well nourished Pulmonary   Normal work of breathing Neurologic   Alert and oriented to person, place, and time Psychiatric   Mood and affect within normal limits  Michelle Massey, [facility] 10/24/23 3:37 PM

## 2023-10-24 NOTE — Assessment & Plan Note (Signed)
-   Anticipatory guidance reviewed.  - Reviewed kick counts and preterm labor warning signs. Instructed to call office or come to hospital with persistent headache, vision changes, regular contractions, leaking of fluid, decreased fetal movement or vaginal bleeding.

## 2023-10-24 NOTE — Progress Notes (Signed)
 BPP order added to growth scan today.

## 2023-10-24 NOTE — Assessment & Plan Note (Signed)
-  BPP 8/8 today. - NST at next visit.

## 2023-10-30 ENCOUNTER — Ambulatory Visit

## 2023-10-30 ENCOUNTER — Encounter: Admitting: Obstetrics

## 2023-10-30 VITALS — BP 122/66 | HR 87 | Ht 64.25 in | Wt 177.9 lb

## 2023-10-30 DIAGNOSIS — O99891 Other specified diseases and conditions complicating pregnancy: Secondary | ICD-10-CM

## 2023-10-30 DIAGNOSIS — Z3A33 33 weeks gestation of pregnancy: Secondary | ICD-10-CM | POA: Diagnosis not present

## 2023-10-30 DIAGNOSIS — M329 Systemic lupus erythematosus, unspecified: Secondary | ICD-10-CM

## 2023-10-30 NOTE — Patient Instructions (Signed)
 Nonstress Test: What to Expect A nonstress test, also called an NST, is done during pregnancy to check your baby's heartbeat. The procedure can help to show if your baby is healthy. It may be done if: Your due date has passed. Your pregnancy is high risk. Your baby is moving less than normal. You've lost a previous pregnancy. Your baby is growing slowly. There's too much or too little fluid around your baby. The NST may be done in the third trimester to find out if it's best for your baby to be born early. During an NST, your baby's heartbeat is watched for at least 20 minutes. If the baby is healthy, the heart rate will go up when the baby moves and will return to normal when the baby rests. This should happen at least twice during the test. Tell a health care provider about: Any allergies you have. Any medical problems you have. All medicines you take. These include vitamins, herbs, eye drops, and creams. Any surgeries you've had. Any past pregnancies you've had. What are the risks? There are no risks to you or your baby from a nonstress test. This procedure shouldn't be painful or uncomfortable. What happens before? Eat a meal right before the test or as told by your health care team. Food may help the baby to move. Use the restroom right before the test. What happens during a nonstress test?  Two monitors will be placed on your belly. One will check your baby's heart rate, and the other will check for contractions. You may be asked to lie down on your side or to sit up. You may be given a button to press when you feel your baby move. If your baby seems to be sleeping, you may be asked to drink some juice or soda, eat a snack, or change positions. These steps may vary. Ask what you can expect. What can I expect after? Your team will talk with you about the results and tell you the next steps. If your team gave you any diet or activity instructions, make sure to follow them. Keep all  follow-up visits. This is important to check on your health and the health of your baby. This information is not intended to replace advice given to you by your health care provider. Make sure you discuss any questions you have with your health care provider. Document Revised: 04/05/2023 Document Reviewed: 04/05/2023 Elsevier Patient Education  2025 [employer].

## 2023-10-30 NOTE — Progress Notes (Signed)
    NURSE VISIT NOTE  Subjective:    Patient ID: Michelle Massey, female    DOB: 24-Jun-1990, 33 y.o.   MRN: [id number]  HPI  Patient is a 33 y.o. G32P1001 female who presents for fetal monitoring per order from Eleanor Canny, CNM.   Objective:    BP 122/66   Pulse 87   Ht 5' 4.25 (1.632 m)   Wt 177 lb 14.4 oz (80.7 kg)   LMP 03/11/2023   BMI 30.30 kg/m  Estimated Date of Delivery: 12/16/23  [redacted]w[redacted]d  Fetus A Non-Stress Test Interpretation for 10/30/23  Indication: Systemic Lupus  Fetal Heart Rate A Mode: External Baseline Rate (A): 130 bpm Variability: Moderate Accelerations: 15 x 15 Decelerations: None Scalp Stimulation: Negative Multiple birth?: No  Uterine Activity Mode: Toco Contraction Frequency (min): None  Interpretation (Fetal Testing) Nonstress Test Interpretation: Reactive Overall Impression: Reassuring for gestational age   Assessment:   1. Systemic lupus complicating pregnancy (HCC)   2. [redacted] weeks gestation of pregnancy      Plan:   Results reviewed by Eleanor Canny, CNM and discussed with patient.   Camelia Bars, LPN

## 2023-11-03 ENCOUNTER — Inpatient Hospital Stay
Admission: EM | Admit: 2023-11-03 | Discharge: 2023-11-04 | Disposition: A | Discharge status: Home / Self Care | Attending: Emergency Medicine | Admitting: Emergency Medicine

## 2023-11-03 ENCOUNTER — Other Ambulatory Visit: Payer: Self-pay

## 2023-11-03 DIAGNOSIS — O212 Late vomiting of pregnancy: Secondary | ICD-10-CM | POA: Insufficient documentation

## 2023-11-03 DIAGNOSIS — O219 Vomiting of pregnancy, unspecified: Secondary | ICD-10-CM

## 2023-11-03 DIAGNOSIS — Z3A34 34 weeks gestation of pregnancy: Secondary | ICD-10-CM | POA: Diagnosis not present

## 2023-11-03 DIAGNOSIS — Z7982 Long term (current) use of aspirin: Secondary | ICD-10-CM | POA: Insufficient documentation

## 2023-11-03 LAB — COMPREHENSIVE METABOLIC PANEL WITH GFR
ALT: 16 U/L (ref 0–44)
AST: 30 U/L (ref 15–41)
Albumin: 3.4 g/dL — ABNORMAL LOW (ref 3.5–5.0)
Alkaline Phosphatase: 88 U/L (ref 38–126)
Anion gap: 13 (ref 5–15)
BUN: 6 mg/dL (ref 6–20)
CO2: 18 mmol/L — ABNORMAL LOW (ref 22–32)
Calcium: 9 mg/dL (ref 8.9–10.3)
Chloride: 103 mmol/L (ref 98–111)
Creatinine, Ser: 0.58 mg/dL (ref 0.44–1.00)
GFR, Estimated: >60 mL/min (ref >60)
Glucose, Bld: 100 mg/dL — ABNORMAL HIGH (ref 70–99)
Potassium: 3.2 mmol/L — ABNORMAL LOW (ref 3.5–5.1)
Sodium: 134 mmol/L — ABNORMAL LOW (ref 135–145)
Total Bilirubin: 0.7 mg/dL (ref 0.0–1.2)
Total Protein: 7.4 g/dL (ref 6.5–8.1)

## 2023-11-03 LAB — CBC WITH DIFFERENTIAL/PLATELET
Abs Immature Granulocytes: 0.08 K/uL — ABNORMAL HIGH (ref 0.00–0.07)
Basophils Absolute: 0 K/uL (ref 0.0–0.1)
Basophils Relative: 0 %
Eosinophils Absolute: 0 K/uL (ref 0.0–0.5)
Eosinophils Relative: 0 %
HCT: 32 % — ABNORMAL LOW (ref 36.0–46.0)
Hemoglobin: 10.7 g/dL — ABNORMAL LOW (ref 12.0–15.0)
Immature Granulocytes: 1 %
Lymphocytes Relative: 21 %
Lymphs Abs: 2.8 K/uL (ref 0.7–4.0)
MCH: 28.3 pg (ref 26.0–34.0)
MCHC: 33.4 g/dL (ref 30.0–36.0)
MCV: 84.7 fL (ref 80.0–100.0)
Monocytes Absolute: 0.8 K/uL (ref 0.1–1.0)
Monocytes Relative: 6 %
Neutro Abs: 9.5 K/uL — ABNORMAL HIGH (ref 1.7–7.7)
Neutrophils Relative %: 72 %
Platelets: 358 K/uL (ref 150–400)
RBC: 3.78 MIL/uL — ABNORMAL LOW (ref 3.87–5.11)
RDW: 13.5 % (ref 11.5–15.5)
WBC: 13.3 K/uL — ABNORMAL HIGH (ref 4.0–10.5)
nRBC: 0 % (ref 0.0–0.2)

## 2023-11-03 MED ORDER — ONDANSETRON 4 MG PO TBDP
4.0000 mg | ORAL_TABLET | Freq: Three times a day (TID) | ORAL | 0 refills | Status: DC | PRN
Start: 2023-11-03 — End: 2023-12-03

## 2023-11-03 MED ORDER — ONDANSETRON HCL 4 MG/2ML IJ SOLN
4.0000 mg | Freq: Once | INTRAMUSCULAR | Status: AC
  Administered 2023-11-03: 4 mg via INTRAVENOUS
  Filled 2023-11-03: qty 2

## 2023-11-03 MED ORDER — DEXTROSE 5 % IN LACTATED RINGERS IV BOLUS
1000.0000 mL | Freq: Once | INTRAVENOUS | Status: AC
  Administered 2023-11-03: 1000 mL via INTRAVENOUS

## 2023-11-03 MED ORDER — FAMOTIDINE 20 MG PO TABS: 20.0000 mg | ORAL_TABLET | Freq: Two times a day (BID) | ORAL | 0 refills | Status: DC

## 2023-11-03 MED ORDER — FAMOTIDINE IN NACL 20-0.9 MG/50ML-% IV SOLN
20.0000 mg | Freq: Once | INTRAVENOUS | Status: AC
  Administered 2023-11-03: 20 mg via INTRAVENOUS
  Filled 2023-11-03: qty 50

## 2023-11-03 NOTE — ED Triage Notes (Signed)
 Pt reports she has been vomiting since yesterday, pt reports today she noticed bright red blood when she vomited. Pt talks in complete sentences no respiratory distress noted

## 2023-11-03 NOTE — ED Provider Notes (Signed)
 [facility] Provider Note    Event Date/Time   First MD Initiated Contact with Patient 11/03/23 2032     (approximate)   History   Chief Complaint: Emesis During Pregnancy   HPI  Michelle Massey is a 33 y.o. female with a history of lupus, currently [redacted] weeks pregnant who comes ED complaining of nausea vomiting since yesterday, unable to tolerate oral intake.  Also reports some small streaks of red blood in her emesis today.  No black or bloody stool.  No dizziness chest pain shortness of breath fever.  Has had issues with recurrent vomiting throughout pregnancy.  No other complications during this pregnancy.  Denies leakage of fluid, vaginal bleeding, decreased fetal movements, or vaginal discharge.  No cramping/contraction pain.      Past Medical History:  Diagnosis Date   Collagen vascular disease [facility])    Lupus    Scoliosis     Current Outpatient Rx   Order #: [id number] Class: Normal   Order #: [id number] Class: Normal   Order #: [id number] Class: OTC   Order #: [id number] Class: Normal   Order #: [id number] Class: Normal   Order #: [id number] Class: Sample    Past Surgical History:  Procedure Laterality Date   NO PAST SURGERIES      Physical Exam   Triage Vital Signs: ED Triage Vitals  Encounter Vitals Group     BP 11/03/23 2013 109/82     Girls Systolic BP Percentile --      Girls Diastolic BP Percentile --      Boys Systolic BP Percentile --      Boys Diastolic BP Percentile --      Pulse Rate 11/03/23 2013 (!) 123     Resp 11/03/23 2013 18     Temp 11/03/23 2013 99 F (37.2 C)     Temp Source 11/03/23 2013 Oral     SpO2 11/03/23 2013 100 %     Weight 11/03/23 2014 178 lb (80.7 kg)     Height 11/03/23 2014 5' 4 (1.626 m)     Head Circumference --      Peak Flow --      Pain Score 11/03/23 2014 0     Pain Loc --      Pain Education --      Exclude from Growth Chart --     Most recent vital signs: Vitals:   11/03/23 2013  BP:  109/82  Pulse: (!) 123  Resp: 18  Temp: 99 F (37.2 C)  SpO2: 100%    General: Awake, no distress.  CV:  Good peripheral perfusion.  Tachycardia heart rate 120 Resp:  Normal effort.  Abd:  No distention.  Soft, nontender.  Gravid. Other:  Moist oral mucosa.   ED Results / Procedures / Treatments   Labs (all labs ordered are listed, but only abnormal results are displayed) Labs Reviewed  COMPREHENSIVE METABOLIC PANEL WITH GFR - Abnormal; Notable for the following components:      Result Value   Sodium 134 (*)    Potassium 3.2 (*)    CO2 18 (*)    Glucose, Bld 100 (*)    Albumin 3.4 (*)    All other components within normal limits  CBC WITH DIFFERENTIAL/PLATELET - Abnormal; Notable for the following components:   WBC 13.3 (*)    RBC 3.78 (*)    Hemoglobin 10.7 (*)    HCT 32.0 (*)    Neutro Abs 9.5 (*)    Abs  Immature Granulocytes 0.08 (*)    All other components within normal limits     EKG    RADIOLOGY    PROCEDURES:  Procedures   MEDICATIONS ORDERED IN ED: Medications  dextrose  5% lactated ringers  bolus 1,000 mL (1,000 mLs Intravenous New Bag/Given 11/03/23 2137)  ondansetron  (ZOFRAN ) injection 4 mg (4 mg Intravenous Given 11/03/23 2054)  famotidine  (PEPCID ) IVPB 20 mg premix (0 mg Intravenous Stopped 11/03/23 2127)     IMPRESSION / MDM / ASSESSMENT AND PLAN / ED COURSE  I reviewed the triage vital signs and the nursing notes.  DDx: Metabolic acidosis, dehydration, AKI, severe electrolyte derangement.  Doubt preeclampsia, venous sinus thrombosis, cholecystitis  Patient's presentation is most consistent with acute presentation with potential threat to life or bodily function.  Patient presents with symptoms of hyperemesis gravidarum.  Vital signs unremarkable except for mild tachycardia.  Labs reassuring.  Feeling better after IV fluids, antacids.  Tolerating oral intake.  Stable for discharge to labor and delivery for triage assessment.        FINAL CLINICAL IMPRESSION(S) / ED DIAGNOSES   Final diagnoses:  Nausea and vomiting during pregnancy     Rx / DC Orders   ED Discharge Orders          Ordered    famotidine  (PEPCID ) 20 MG tablet  2 times daily        11/03/23 2319    ondansetron  (ZOFRAN -ODT) 4 MG disintegrating tablet  Every 8 hours PRN        11/03/23 2319             Note:  This document was prepared using Dragon voice recognition software and may include unintentional dictation errors.   Viviann Pastor, MD 11/03/23 2325

## 2023-11-03 NOTE — ED Notes (Signed)
 Talk to L&D nurse who reports per provider in unit to clear pt for stomach bug, if pt needs fluids to start fluids, no complaints with fetus since pt reported great fetal movement, per RN no ned to see pt upstairs since no concerns with pregnancy

## 2023-11-04 ENCOUNTER — Encounter: Payer: Self-pay | Admitting: Obstetrics

## 2023-11-04 DIAGNOSIS — O219 Vomiting of pregnancy, unspecified: Secondary | ICD-10-CM | POA: Diagnosis not present

## 2023-11-04 DIAGNOSIS — Z3A34 34 weeks gestation of pregnancy: Secondary | ICD-10-CM | POA: Diagnosis not present

## 2023-11-04 DIAGNOSIS — O212 Late vomiting of pregnancy: Secondary | ICD-10-CM | POA: Diagnosis not present

## 2023-11-04 LAB — URINALYSIS, ROUTINE W REFLEX MICROSCOPIC
Bilirubin Urine: NEGATIVE
Glucose, UA: NEGATIVE mg/dL
Hgb urine dipstick: NEGATIVE
Ketones, ur: NEGATIVE mg/dL
Leukocytes,Ua: NEGATIVE
Nitrite: NEGATIVE
Protein, ur: NEGATIVE mg/dL
Specific Gravity, Urine: 1.002 — ABNORMAL LOW (ref 1.005–1.030)
pH: 6 (ref 5.0–8.0)

## 2023-11-04 MED ORDER — LACTATED RINGERS IV SOLN: INTRAVENOUS | Status: DC

## 2023-11-04 NOTE — OB Triage Provider Note (Signed)
   L&D OB Triage Note  SUBJECTIVE Michelle Massey is a 33 y.o. G2P1001 female at [redacted]w[redacted]d, EDD Estimated Date of Delivery: 12/16/23 who was seen in the ED for evaluation of blood with vomiting. Pt states she has been vomiting since yesterday . This mostly occurs with indigestion and noticed some streaks of  blood. She was seen in the ED and cleared. She Presented to ob triage for evaluation of the baby.   OB History  Gravida Para Term Preterm AB Living  2 1 1  0 0 1  SAB IAB Ectopic Multiple Live Births  0 0 0 0 1    # Outcome Date GA Lbr Len/2nd Weight Sex Type Anes PTL Lv  2 Current           1 Term 04/26/20 [redacted]w[redacted]d 16:50 / 00:42 3530 g M Vag-Spont EPI  LIV     Name: Pharris,BOY Layken     Apgar1: 9  Apgar5: 10    Medications Prior to Admission  Medication Sig Dispense Refill Last Dose/Taking   Ferric Maltol  (ACCRUFER ) 30 MG CAPS Take 1 capsule (30 mg total) by mouth in the morning and at bedtime. 60 capsule 6 11/03/2023   Prenat-Fe Poly-Methfol-FA-DHA (VITAFOL  ULTRA) 29-0.6-0.4-200 MG CAPS Take 1 capsule by mouth daily.   11/03/2023   aspirin  EC 81 MG tablet Take 1 tablet (81 mg total) by mouth daily. Swallow whole.      ondansetron  (ZOFRAN -ODT) 4 MG disintegrating tablet Take 1 tablet (4 mg total) by mouth every 8 (eight) hours as needed for nausea or vomiting. (Patient not taking: Reported on 10/30/2023) 30 tablet 0      OBJECTIVE  Nursing Evaluation:   BP 114/60 (BP Location: Right Arm)   Pulse 85   Temp 98.5 F (36.9 C) (Oral)   Resp 16   Ht 5' 4 (1.626 m)   Wt 80.7 kg   LMP 03/11/2023   SpO2 100%   BMI 30.55 kg/m    Findings:      Reactive NST       NST was performed and has been reviewed by me.  NST INTERPRETATION: Category I  Mode: External Baseline Rate (A): 125 bpm Variability: Moderate Accelerations: 15 x 15 Decelerations: None     Contraction Frequency (min): none noted  ASSESSMENT Impression:  1.  Pregnancy:  G2P1001 at [redacted]w[redacted]d , EDD Estimated Date of Delivery:  12/16/23 2.  Reassuring fetal and maternal status 3.  Nausea and vomiting in pregnancy- she was treated in the ED with IV fluids and antemetic.    PLAN 1. Current condition and above findings reviewed.  Reassuring fetal and maternal condition. Orders for zofran  and Pepcid .   2. Discharge home with standard labor precautions given to return to L&D or call the office for problems. 3. Continue routine prenatal care.  Zelda Hummer, CNM

## 2023-11-05 NOTE — Progress Notes (Unsigned)
    Return Prenatal Note   Subjective  33 y.o. G2P1001 at [redacted]w[redacted]d presents for this follow-up prenatal visit.  Patient ***  Patient reports:   Denies vaginal bleeding or leaking fluid. Objective  Flow sheet Vitals:   Total weight gain: 29 lb 14.4 oz (13.6 kg)  General Appearance  No acute distress, well appearing, and well nourished Pulmonary   Normal work of breathing Neurologic   Alert and oriented to person, place, and time Psychiatric   Mood and affect within normal limits  TANAE PETROSKY 10-14-1990 [redacted]w[redacted]d  Fetus A Non-Stress Test Interpretation for 11/05/23  Indication: {MFM NST INDICATIONS:20869}             Assessment/Plan   Plan  33 y.o. G2P1001 at [redacted]w[redacted]d presents for follow-up OB visit. Reviewed prenatal record including previous visit note. There are no diagnoses linked to this encounter.  No problem-specific Assessment & Plan notes found for this encounter.    No orders of the defined types were placed in this encounter.  No follow-ups on file.   Future Appointments  Date Time Provider Department Center  11/06/2023 10:15 AM AOB-NST ROOM [facility]OB-AOB None  11/06/2023 10:35 AM Leigh Sober, MD AOB-AOB None  11/13/2023  9:45 AM AOB-NST ROOM AOB-AOB None  11/13/2023 10:15 AM Dominic, Jinnie Jansky, CNM AOB-AOB None  11/20/2023 10:15 AM AOB-NST ROOM AOB-AOB None  11/20/2023 10:55 AM Dominic, Jinnie Jansky, CNM AOB-AOB None  11/27/2023 10:15 AM AOB-NST ROOM AOB-AOB None  11/27/2023 10:35 AM Slaughterbeck, Damien, CNM AOB-AOB None  02/28/2024  9:00 AM Scoggins, Jeoffrey, NP AMA-AMA None  06/02/2024  9:15 AM Jackquline Sawyer, MD ASC-ASC None    For next visit:  {SJFprenatalcare:29716}      Sober Leigh, DO [facility]

## 2023-11-06 ENCOUNTER — Ambulatory Visit: Admitting: Obstetrics

## 2023-11-06 ENCOUNTER — Other Ambulatory Visit

## 2023-11-06 VITALS — BP 120/72 | HR 86 | Wt 173.8 lb

## 2023-11-06 DIAGNOSIS — O0993 Supervision of high risk pregnancy, unspecified, third trimester: Secondary | ICD-10-CM

## 2023-11-06 DIAGNOSIS — O99891 Other specified diseases and conditions complicating pregnancy: Secondary | ICD-10-CM

## 2023-11-06 DIAGNOSIS — O0990 Supervision of high risk pregnancy, unspecified, unspecified trimester: Secondary | ICD-10-CM

## 2023-11-06 DIAGNOSIS — M329 Systemic lupus erythematosus, unspecified: Secondary | ICD-10-CM | POA: Diagnosis not present

## 2023-11-06 DIAGNOSIS — L932 Other local lupus erythematosus: Secondary | ICD-10-CM

## 2023-11-06 DIAGNOSIS — Z3A34 34 weeks gestation of pregnancy: Secondary | ICD-10-CM

## 2023-11-06 DIAGNOSIS — O99013 Anemia complicating pregnancy, third trimester: Secondary | ICD-10-CM

## 2023-11-06 DIAGNOSIS — D649 Anemia, unspecified: Secondary | ICD-10-CM

## 2023-11-06 NOTE — Progress Notes (Signed)
    Return Prenatal Note   Subjective  33 y.o. G2P1001 at [redacted]w[redacted]d presents for this follow-up prenatal visit. 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   Patient is well today, ready for baby, is already packed, car seat installed. Is having occasional BH. Seen in ED 7/12 for N/V and is much better, feels she had a 24hr bug.   Patient reports: Movement: Present Contractions: Irritability Denies vaginal bleeding or leaking fluid. Objective  Flow sheet Vitals: Pulse Rate: 86 BP: 120/72 Total weight gain: 25 lb 12.8 oz (11.7 kg)  General Appearance  No acute distress, well appearing, and well nourished Pulmonary   Normal work of breathing Neurologic   Alert and oriented to person, place, and time Psychiatric   Mood and affect within normal limits  Fetus A Non-Stress Test Interpretation for 11/06/23 Indication: systemic lupus  Fetal Heart Rate A Mode: External Baseline Rate (A): 130 bpm Variability: Moderate Accelerations: 15 x 15 Decelerations: None Scalp Stimulation: Negative Multiple birth?: No  Uterine Activity Mode: Toco Contraction Frequency (min): none Contraction Duration (sec): none  Interpretation (Fetal Testing) Nonstress Test Interpretation: Reactive Overall Impression: Reassuring for gestational age    Assessment/Plan   Plan  33 y.o. G2P1001 at [redacted]w[redacted]d by LMP=13wk US  presents for follow-up OB visit. Reviewed prenatal record including previous visit note.  1. Supervision of high risk pregnancy, antepartum (Primary) 2. 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 Anemia affecting pregnancy in third trimester -Last Hgb 7/12 10.7; continue daily iron supplement  Return in about 1 week (around 11/13/2023) for NST only, 2wks: ROB w/growth US  w/BPP.   Future Appointments  Date Time Provider Department  Center  11/13/2023  9:45 AM AOB-NST ROOM [facility] None  11/13/2023 10:15 AM Dominic, Jinnie Jansky, CNM [facility] None  11/19/2023  3:00 PM AOB-AOB US  1 AOB-IMG None  11/20/2023 10:55 AM Dominic, Jinnie Jansky, CNM [facility] None  11/27/2023 10:15 AM AOB-NST ROOM [facility] None  11/27/2023 10:35 AM Slaughterbeck, Damien, CNM AOB-AOB None  02/28/2024  9:00 AM Scoggins, Jeoffrey, NP A[facility] None  06/02/2024  9:15 AM Jackquline Sawyer, MD [facility] None    For next visit:  ROB with GBS screening     Estil Mangle, DO [facility]

## 2023-11-06 NOTE — Patient Instructions (Signed)
 Nonstress Test: What to Expect A nonstress test, also called an NST, is done during pregnancy to check your baby's heartbeat. The procedure can help to show if your baby is healthy. It may be done if: Your due date has passed. Your pregnancy is high risk. Your baby is moving less than normal. You've lost a previous pregnancy. Your baby is growing slowly. There's too much or too little fluid around your baby. The NST may be done in the third trimester to find out if it's best for your baby to be born early. During an NST, your baby's heartbeat is watched for at least 20 minutes. If the baby is healthy, the heart rate will go up when the baby moves and will return to normal when the baby rests. This should happen at least twice during the test. Tell a health care provider about: Any allergies you have. Any medical problems you have. All medicines you take. These include vitamins, herbs, eye drops, and creams. Any surgeries you've had. Any past pregnancies you've had. What are the risks? There are no risks to you or your baby from a nonstress test. This procedure shouldn't be painful or uncomfortable. What happens before? Eat a meal right before the test or as told by your health care team. Food may help the baby to move. Use the restroom right before the test. What happens during a nonstress test?  Two monitors will be placed on your belly. One will check your baby's heart rate, and the other will check for contractions. You may be asked to lie down on your side or to sit up. You may be given a button to press when you feel your baby move. If your baby seems to be sleeping, you may be asked to drink some juice or soda, eat a snack, or change positions. These steps may vary. Ask what you can expect. What can I expect after? Your team will talk with you about the results and tell you the next steps. If your team gave you any diet or activity instructions, make sure to follow them. Keep all  follow-up visits. This is important to check on your health and the health of your baby. This information is not intended to replace advice given to you by your health care provider. Make sure you discuss any questions you have with your health care provider. Document Revised: 04/05/2023 Document Reviewed: 04/05/2023 Elsevier Patient Education  2025 [employer].

## 2023-11-08 ENCOUNTER — Encounter: Payer: Self-pay | Admitting: Obstetrics

## 2023-11-13 ENCOUNTER — Ambulatory Visit (INDEPENDENT_AMBULATORY_CARE_PROVIDER_SITE_OTHER): Admitting: Licensed Practical Nurse

## 2023-11-13 ENCOUNTER — Other Ambulatory Visit

## 2023-11-13 VITALS — BP 117/79 | HR 93 | Wt 173.6 lb

## 2023-11-13 DIAGNOSIS — Z3A35 35 weeks gestation of pregnancy: Secondary | ICD-10-CM | POA: Insufficient documentation

## 2023-11-13 DIAGNOSIS — N898 Other specified noninflammatory disorders of vagina: Secondary | ICD-10-CM | POA: Insufficient documentation

## 2023-11-13 DIAGNOSIS — M329 Systemic lupus erythematosus, unspecified: Secondary | ICD-10-CM

## 2023-11-13 DIAGNOSIS — O0993 Supervision of high risk pregnancy, unspecified, third trimester: Secondary | ICD-10-CM

## 2023-11-13 DIAGNOSIS — O99891 Other specified diseases and conditions complicating pregnancy: Secondary | ICD-10-CM

## 2023-11-13 DIAGNOSIS — O0990 Supervision of high risk pregnancy, unspecified, unspecified trimester: Secondary | ICD-10-CM

## 2023-11-13 NOTE — Progress Notes (Signed)
    Return Prenatal Note   Subjective   33 y.o. G2P1001 at [redacted]w[redacted]d presents for this follow-up prenatal visit.  Patient  Patient reports: Doing well. Sleep is rough, mood is good. She noticed a lump in her vagina a about 1 week ago, it does not hurt, she is not sure how long it has been there-she just happened to touch that area and noticed it. Denies vaginal odor or discharge.  Pinpoint inclusion cyst present on Right labia-will monitor for now. HO given.  Movement: Present Contractions: Irritability  Objective   Flow sheet Vitals: Pulse Rate: 93 BP: 117/79 Fundal Height:  (34.5) Fetal Heart Rate (bpm): 150 Total weight gain: 25 lb 9.6 oz (11.6 kg)  BILLI BRIGHT 15-Oct-1990 [redacted]w[redacted]d  Fetus A Non-Stress Test Interpretation for 11/13/23  Indication: Systemic Lupus  Fetal Heart Rate A Mode: External Baseline Rate (A): 125 bpm Variability: Moderate Accelerations: 15 x 15 Decelerations: None Scalp Stimulation: Negative Multiple birth?: No  Uterine Activity Mode: Toco Contraction Frequency (min): None  Interpretation (Fetal Testing) Nonstress Test Interpretation: Reactive Overall Impression: Reassuring for gestational age   General Appearance  No acute distress, well appearing, and well nourished Pulmonary   Normal work of breathing Neurologic   Alert and oriented to person, place, and time Psychiatric   Mood and affect within normal limits  Assessment/Plan   Plan  33 y.o. G2P1001 at [redacted]w[redacted]d presents for follow-up OB visit. Reviewed prenatal record including previous visit note.  No problem-specific Assessment & Plan notes found for this encounter.      No orders of the defined types were placed in this encounter.  Return in about 6 days (around 11/19/2023) for as scheduled for growth/BPP.   Future Appointments  Date Time Provider Department Center  11/19/2023  3:00 PM AOB-AOB US  1 AOB-IMG None  11/20/2023 10:55 AM Kelsy Polack, Jinnie Jansky, CNM AOB-AOB None   11/27/2023 10:15 AM AOB-NST ROOM AOB-AOB None  11/27/2023 10:35 AM Slaughterbeck, Damien, CNM AOB-AOB None  02/28/2024  9:00 AM Scoggins, Jeoffrey, NP AMA-AMA None  06/02/2024  9:15 AM Jackquline Sawyer, MD ASC-[facility] None    For next visit:  ROB with NST gbs next visit     JINNIE CHRISTELLA [facility], CNM  11/12/2509:08 AM

## 2023-11-13 NOTE — Assessment & Plan Note (Signed)
-  Her mother or sister will watch her 33 y/o -Her boyfriend will be her labor support  -Admits she was stressed after the birth of her son-had limited support, feels that she has bette support this time.  -has MFM fu scheduled -warning signs reviewed

## 2023-11-13 NOTE — Assessment & Plan Note (Signed)
 Monitor for now

## 2023-11-13 NOTE — Patient Instructions (Signed)
 Nonstress Test: What to Expect A nonstress test, also called an NST, is done during pregnancy to check your baby's heartbeat. The procedure can help to show if your baby is healthy. It may be done if: Your due date has passed. Your pregnancy is high risk. Your baby is moving less than normal. You've lost a previous pregnancy. Your baby is growing slowly. There's too much or too little fluid around your baby. The NST may be done in the third trimester to find out if it's best for your baby to be born early. During an NST, your baby's heartbeat is watched for at least 20 minutes. If the baby is healthy, the heart rate will go up when the baby moves and will return to normal when the baby rests. This should happen at least twice during the test. Tell a health care provider about: Any allergies you have. Any medical problems you have. All medicines you take. These include vitamins, herbs, eye drops, and creams. Any surgeries you've had. Any past pregnancies you've had. What are the risks? There are no risks to you or your baby from a nonstress test. This procedure shouldn't be painful or uncomfortable. What happens before? Eat a meal right before the test or as told by your health care team. Food may help the baby to move. Use the restroom right before the test. What happens during a nonstress test?  Two monitors will be placed on your belly. One will check your baby's heart rate, and the other will check for contractions. You may be asked to lie down on your side or to sit up. You may be given a button to press when you feel your baby move. If your baby seems to be sleeping, you may be asked to drink some juice or soda, eat a snack, or change positions. These steps may vary. Ask what you can expect. What can I expect after? Your team will talk with you about the results and tell you the next steps. If your team gave you any diet or activity instructions, make sure to follow them. Keep all  follow-up visits. This is important to check on your health and the health of your baby. This information is not intended to replace advice given to you by your health care provider. Make sure you discuss any questions you have with your health care provider. Document Revised: 04/05/2023 Document Reviewed: 04/05/2023 Elsevier Patient Education  2025 [employer].

## 2023-11-19 ENCOUNTER — Ambulatory Visit

## 2023-11-19 DIAGNOSIS — Z3A34 34 weeks gestation of pregnancy: Secondary | ICD-10-CM

## 2023-11-19 DIAGNOSIS — L932 Other local lupus erythematosus: Secondary | ICD-10-CM | POA: Diagnosis not present

## 2023-11-19 DIAGNOSIS — O99713 Diseases of the skin and subcutaneous tissue complicating pregnancy, third trimester: Secondary | ICD-10-CM | POA: Diagnosis not present

## 2023-11-20 ENCOUNTER — Ambulatory Visit: Admitting: Licensed Practical Nurse

## 2023-11-20 ENCOUNTER — Other Ambulatory Visit (HOSPITAL_COMMUNITY)
Admission: RE | Admit: 2023-11-20 | Discharge: 2023-11-20 | Disposition: A | Discharge status: Home / Self Care | Source: Ambulatory Visit | Attending: Licensed Practical Nurse | Admitting: Licensed Practical Nurse

## 2023-11-20 ENCOUNTER — Other Ambulatory Visit

## 2023-11-20 ENCOUNTER — Encounter: Payer: Self-pay | Admitting: Licensed Practical Nurse

## 2023-11-20 VITALS — BP 121/82 | HR 85 | Wt 175.3 lb

## 2023-11-20 DIAGNOSIS — O368330 Maternal care for abnormalities of the fetal heart rate or rhythm, third trimester, not applicable or unspecified: Secondary | ICD-10-CM | POA: Diagnosis not present

## 2023-11-20 DIAGNOSIS — L932 Other local lupus erythematosus: Secondary | ICD-10-CM

## 2023-11-20 DIAGNOSIS — O0990 Supervision of high risk pregnancy, unspecified, unspecified trimester: Secondary | ICD-10-CM

## 2023-11-20 DIAGNOSIS — Z3A36 36 weeks gestation of pregnancy: Secondary | ICD-10-CM | POA: Diagnosis not present

## 2023-11-20 DIAGNOSIS — Z113 Encounter for screening for infections with a predominantly sexual mode of transmission: Secondary | ICD-10-CM | POA: Diagnosis present

## 2023-11-20 DIAGNOSIS — Z3685 Encounter for antenatal screening for Streptococcus B: Secondary | ICD-10-CM

## 2023-11-20 NOTE — Progress Notes (Signed)
    Return Prenatal Note   Subjective   33 y.o. G2P1001 at [redacted]w[redacted]d presents for this follow-up prenatal visit.  Patient  Patient reports: Doing well. Sleep disturbed, has leg cramps/restless legs and heartburn. Feeling  a lot of pelvic pressure, was told at her US  that the fetus is her pelvis. Fetal heart rate initially 170's-180's, placed on NST.  Movement: Present Contractions: Irritability  Objective   Flow sheet Vitals: Pulse Rate: 85 BP: 121/82 Fundal Height: 36 cm Fetal Heart Rate (bpm): 140 Presentation: Vertex Total weight gain: 27 lb 4.8 oz (12.4 kg)   Michelle Massey 05-Apr-1991 [redacted]w[redacted]d  Fetus A Non-Stress Test Interpretation for 11/20/23  Indication: fetal tachycardia with doppler   Fetal Heart Rate A Mode: External Baseline Rate (A): 140 bpm Accelerations: 15 x 15 Decelerations: None Multiple birth?: No  Uterine Activity Mode: None  Interpretation (Fetal Testing) Nonstress Test Interpretation: Reactive   General Appearance  No acute distress, well appearing, and well nourished Pulmonary   Normal work of breathing Neurologic   Alert and oriented to person, place, and time Psychiatric   Mood and affect within normal limits  Assessment/Plan   Plan  33 y.o. G2P1001 at [redacted]w[redacted]d presents for follow-up OB visit. Reviewed prenatal record including previous visit note.  Supervision of high risk pregnancy, antepartum -BPP 8/8 on 7/28 -TWG 27lbs, not too bad, much better compared to her first pregnancy weight gain  -Has all baby items -Her mother will watch her son while in labor, her partner will be her labor support. They have family available for PP support.  -undecided on contraception  -warning signs reviewed   Cutaneous lupus erythematosus -RNST  -Per MFM protocol IOL at 39 wks, reviewed with pt      Orders Placed This Encounter  Procedures   Culture, beta strep (group b only)   Return in about 1 week (around 11/27/2023) for ROB, NST .   Future  Appointments  Date Time Provider Department Center  11/27/2023 10:15 AM AOB-NST ROOM AOB-AOB None  11/27/2023 10:35 AM Slaughterbeck, Damien, CNM AOB-AOB None  02/28/2024  9:00 AM Scoggins, Jeoffrey, NP AMA-AMA None  06/02/2024  9:15 AM Jackquline Sawyer, MD ASC-ASC None    For next visit:  ROB with NST     [facility], CNM  11/19/2509:53 AM

## 2023-11-20 NOTE — Assessment & Plan Note (Addendum)
-  BPP 8/8 on 7/28 -TWG 27lbs, not too bad, much better compared to her first pregnancy weight gain  -Has all baby items -Her mother will watch her son while in labor, her partner will be her labor support. They have family available for PP support.  -undecided on contraception  -warning signs reviewed

## 2023-11-20 NOTE — Assessment & Plan Note (Addendum)
-  RNST  -Per MFM protocol IOL at 39 wks, reviewed with pt

## 2023-11-21 LAB — CERVICOVAGINAL ANCILLARY ONLY
Chlamydia: NEGATIVE
Comment: NEGATIVE
Comment: NORMAL
Neisseria Gonorrhea: NEGATIVE

## 2023-11-24 LAB — CULTURE, BETA STREP (GROUP B ONLY): Strep Gp B Culture: NEGATIVE

## 2023-11-26 NOTE — Progress Notes (Addendum)
    Return Prenatal Note   Subjective   33 y.o. G2P1001 at [redacted]w[redacted]d presents for this follow-up prenatal visit.  Patient is doing well. She has been having some mucous discharge. She had her weekly NST today. Patient reports: Movement: Present Contractions: Regular  Objective   Flow sheet Vitals: Pulse Rate: 98 BP: 114/69 Fundal Height: 37 cm Fetal Heart Rate (bpm): 130 Total weight gain: 30 lb 8 oz (13.8 kg)  SHELA ESSES 1990-05-27 [redacted]w[redacted]d  Fetus A Non-Stress Test Interpretation for 11/28/23  Indication: Cutaneous lupus erythematosus  Fetal Heart Rate A Mode: External Baseline Rate (A): 130 bpm Variability: Moderate Accelerations: 15 x 15 Decelerations: None Scalp Stimulation: Negative Multiple birth?: No  Uterine Activity Mode: Toco Contraction Frequency (min): irritability Contraction Quality: Mild  Interpretation (Fetal Testing) Nonstress Test Interpretation: Reactive Overall Impression: Reassuring for gestational age    General Appearance  No acute distress, well appearing, and well nourished Pulmonary   Normal work of breathing Neurologic   Alert and oriented to person, place, and time Psychiatric   Mood and affect within normal limits  Assessment/Plan   Plan  33 y.o. G2P1001 at [redacted]w[redacted]d presents for follow-up OB visit. Reviewed prenatal record including previous visit note.  Cutaneous lupus erythematosus Reviewed recommendation for IOL. Will schedule for 39 weeks.   Supervision of high risk pregnancy, antepartum Reviewed reative NST. Reviewed what to expect including use of misoprostol , Cooks catheter, pitocin  and AROM as potential interventions. Reviewed typical timing for inductions.        Orders Placed This Encounter  Procedures   Fetal nonstress test    Standing Status:   Future    Expiration Date:   11/27/2024   No follow-ups on file.   Future Appointments  Date Time Provider Department Center  12/05/2023  3:15 PM AOB-NST ROOM AOB-AOB  None  12/05/2023  3:35 PM Leigh Sober, MD AOB-AOB None  02/28/2024  9:00 AM Scoggins, Jeoffrey, NP AMA-AMA None  06/02/2024  9:15 AM Jackquline Sawyer, MD ASC-[facility] None    For next visit:  continue with routine prenatal care     Damien Parsley, CNM [facility] 08/06/251:03 PM

## 2023-11-27 ENCOUNTER — Ambulatory Visit (INDEPENDENT_AMBULATORY_CARE_PROVIDER_SITE_OTHER): Admitting: Certified Nurse Midwife

## 2023-11-27 ENCOUNTER — Encounter: Payer: Self-pay | Admitting: Certified Nurse Midwife

## 2023-11-27 ENCOUNTER — Other Ambulatory Visit

## 2023-11-27 VITALS — BP 114/69 | HR 98 | Wt 178.5 lb

## 2023-11-27 DIAGNOSIS — O0993 Supervision of high risk pregnancy, unspecified, third trimester: Secondary | ICD-10-CM | POA: Diagnosis not present

## 2023-11-27 DIAGNOSIS — L930 Discoid lupus erythematosus: Secondary | ICD-10-CM

## 2023-11-27 DIAGNOSIS — O99713 Diseases of the skin and subcutaneous tissue complicating pregnancy, third trimester: Secondary | ICD-10-CM

## 2023-11-27 DIAGNOSIS — Z3A37 37 weeks gestation of pregnancy: Secondary | ICD-10-CM | POA: Diagnosis not present

## 2023-11-27 DIAGNOSIS — O99891 Other specified diseases and conditions complicating pregnancy: Secondary | ICD-10-CM

## 2023-11-27 DIAGNOSIS — O0990 Supervision of high risk pregnancy, unspecified, unspecified trimester: Secondary | ICD-10-CM

## 2023-11-27 DIAGNOSIS — L932 Other local lupus erythematosus: Secondary | ICD-10-CM

## 2023-11-27 NOTE — Patient Instructions (Signed)
 Nonstress Test: What to Expect A nonstress test, also called an NST, is done during pregnancy to check your baby's heartbeat. The procedure can help to show if your baby is healthy. It may be done if: Your due date has passed. Your pregnancy is high risk. Your baby is moving less than normal. You've lost a previous pregnancy. Your baby is growing slowly. There's too much or too little fluid around your baby. The NST may be done in the third trimester to find out if it's best for your baby to be born early. During an NST, your baby's heartbeat is watched for at least 20 minutes. If the baby is healthy, the heart rate will go up when the baby moves and will return to normal when the baby rests. This should happen at least twice during the test. Tell a health care provider about: Any allergies you have. Any medical problems you have. All medicines you take. These include vitamins, herbs, eye drops, and creams. Any surgeries you've had. Any past pregnancies you've had. What are the risks? There are no risks to you or your baby from a nonstress test. This procedure shouldn't be painful or uncomfortable. What happens before? Eat a meal right before the test or as told by your health care team. Food may help the baby to move. Use the restroom right before the test. What happens during a nonstress test?  Two monitors will be placed on your belly. One will check your baby's heart rate, and the other will check for contractions. You may be asked to lie down on your side or to sit up. You may be given a button to press when you feel your baby move. If your baby seems to be sleeping, you may be asked to drink some juice or soda, eat a snack, or change positions. These steps may vary. Ask what you can expect. What can I expect after? Your team will talk with you about the results and tell you the next steps. If your team gave you any diet or activity instructions, make sure to follow them. Keep all  follow-up visits. This is important to check on your health and the health of your baby. This information is not intended to replace advice given to you by your health care provider. Make sure you discuss any questions you have with your health care provider. Document Revised: 04/05/2023 Document Reviewed: 04/05/2023 Elsevier Patient Education  2025 [employer].

## 2023-11-28 NOTE — Assessment & Plan Note (Signed)
 Reviewed reative NST. Reviewed what to expect including use of misoprostol , Cooks catheter, pitocin  and AROM as potential interventions. Reviewed typical timing for inductions.

## 2023-11-28 NOTE — Assessment & Plan Note (Signed)
 Reviewed recommendation for IOL. Will schedule for 39 weeks.

## 2023-12-03 ENCOUNTER — Other Ambulatory Visit: Payer: Self-pay

## 2023-12-03 ENCOUNTER — Observation Stay
Admission: EM | Admit: 2023-12-03 | Discharge: 2023-12-03 | Disposition: A | Discharge status: Home / Self Care | Attending: Obstetrics | Admitting: Obstetrics

## 2023-12-03 ENCOUNTER — Encounter: Payer: Self-pay | Admitting: Obstetrics

## 2023-12-03 DIAGNOSIS — Z7982 Long term (current) use of aspirin: Secondary | ICD-10-CM | POA: Diagnosis not present

## 2023-12-03 DIAGNOSIS — Z87891 Personal history of nicotine dependence: Secondary | ICD-10-CM | POA: Diagnosis not present

## 2023-12-03 DIAGNOSIS — O26853 Spotting complicating pregnancy, third trimester: Secondary | ICD-10-CM | POA: Diagnosis present

## 2023-12-03 DIAGNOSIS — N898 Other specified noninflammatory disorders of vagina: Secondary | ICD-10-CM

## 2023-12-03 DIAGNOSIS — O0990 Supervision of high risk pregnancy, unspecified, unspecified trimester: Principal | ICD-10-CM

## 2023-12-03 DIAGNOSIS — Z3A38 38 weeks gestation of pregnancy: Secondary | ICD-10-CM | POA: Insufficient documentation

## 2023-12-03 DIAGNOSIS — N939 Abnormal uterine and vaginal bleeding, unspecified: Secondary | ICD-10-CM | POA: Diagnosis present

## 2023-12-03 LAB — CHLAMYDIA/NGC RT PCR (ARMC ONLY)
Chlamydia Tr: NOT DETECTED
N gonorrhoeae: NOT DETECTED

## 2023-12-03 LAB — WET PREP, GENITAL
Sperm: NONE SEEN
Trich, Wet Prep: NONE SEEN
WBC, Wet Prep HPF POC: <10 (ref <10)
Yeast Wet Prep HPF POC: NONE SEEN

## 2023-12-03 MED ORDER — METRONIDAZOLE 500 MG PO TABS: 500.0000 mg | ORAL_TABLET | Freq: Two times a day (BID) | ORAL | 0 refills | Status: AC

## 2023-12-03 NOTE — Progress Notes (Signed)
 Discharged home stable, ambulatory, and by self. Pt states that she understands to pick up her medication from the pharmacy and will discuss following lab results at her prenatal. Signs and symptoms of labor, FM, and LOF reviewed with pt.

## 2023-12-03 NOTE — Final Progress Note (Signed)
 OB/Triage Note  Patient ID: Michelle Massey MRN: [id number] DOB/AGE: 23-Jul-1990 33 y.o.  Subjective  History of Present Illness: The patient is a 32 y.o. female G2P1001 at [redacted]w[redacted]d who presents for vaginal spotting several times this morning after voiding. With wiping she noticed a small amount of blood on the tissue paper. Also reports some braxton hicks like contractions this morning. Denies painful contractions, LOF. Endorses good FM. Also reports green mucus like vaginal discharge for the past week. Denies dysuria, odor to discharge, burning/itching.   Past Medical History:  Diagnosis Date   Collagen vascular disease (HCC)    Lupus    Scoliosis     Past Surgical History:  Procedure Laterality Date   NO PAST SURGERIES      No current facility-administered medications on file prior to encounter.   Current Outpatient Medications on File Prior to Encounter  Medication Sig Dispense Refill   aspirin  EC 81 MG tablet Take 1 tablet (81 mg total) by mouth daily. Swallow whole.     famotidine  (PEPCID ) 20 MG tablet Take 1 tablet (20 mg total) by mouth 2 (two) times daily. 60 tablet 0   Ferric Maltol  (ACCRUFER ) 30 MG CAPS Take 1 capsule (30 mg total) by mouth in the morning and at bedtime. 60 capsule 6   ondansetron  (ZOFRAN -ODT) 4 MG disintegrating tablet Take 1 tablet (4 mg total) by mouth every 8 (eight) hours as needed for nausea or vomiting. 30 tablet 0   Prenat-Fe Poly-Methfol-FA-DHA (VITAFOL  ULTRA) 29-0.6-0.4-200 MG CAPS Take 1 capsule by mouth daily.      Not on File  Social History   Socioeconomic History   Marital status: Single    Spouse name: Not on file   Number of children: 0   Years of education: 10-11   Highest education level: Not on file  Occupational History   Not on file  Tobacco Use   Smoking status: Former    Current packs/day: 0.00    Average packs/day: 0.5 packs/day for 6.7 years (3.4 ttl pk-yrs)    Types: Cigarettes    Start date: 2013    Quit date:  01/22/2018    Years since quitting: 5.8   Smokeless tobacco: Never  Vaping Use   Vaping status: Never Used  Substance and Sexual Activity   Alcohol use: Not Currently   Drug use: Not Currently    Types: Marijuana    Comment: decreased to a couple times a week - last use was in June 2025   Sexual activity: Yes    Birth control/protection: None  Other Topics Concern   Not on file  Social History Narrative   Not on file   Social Drivers of Health   Financial Resource Strain: Not on file  Food Insecurity: Not on file  Transportation Needs: Not on file  Physical Activity: Not on file  Stress: Not on file  Social Connections: Not on file  Intimate Partner Violence: Not on file    Family History  Problem Relation Age of Onset   Hodgkin's lymphoma Mother    Hypertension Mother    Cancer Mother    Asthma Mother    Bipolar disorder Sister    Schizophrenia Sister    Diabetes Maternal Great-grandfather    Hypertension Maternal Great-grandfather    Breast cancer Neg Hx    Ovarian cancer Neg Hx    Colon cancer Neg Hx      ROS    Objective  Physical Exam: BP 126/82 (BP Location:  Left Arm)   Temp 98.6 F (37 C) (Oral)   Resp 18   LMP 03/11/2023   OBGyn Exam  FHT 130, mod variability, pos accels, no decels Toco uterine irritability with rare contractions present  Significant Findings/ Diagnostic Studies:  GC/CT pending Wet prep positive for BV RNST  Hospital Course: The patient was admitted to [facility] Triage for observation. No vaginal discharge or bleeding present in triage. Had reactive NST. Offered SVE which patient accepted, also consented to wet prep and GC/CT. Not in active labor. Had IOL scheduled for 8/17 and repeat ROB on Wednesday. Labor precautions discussed. Flagyl  ordered for BV, medication discussed. GC/CT unlikely, will ask provider to check results at [facility] in two days. Discussed spotting likely related to her body preparing for labor and slight contractions  now.  Assessment: 33 y.o. female G2P1001 at [redacted]w[redacted]d  RNST Not in labor Bacterial vaginosis  Plan: Discharge home Follow up at next ROB Teaching: take full course of flagyl  as discussed  Discharge Instructions     Discharge activity:  No Restrictions   Complete by: As directed    Discharge diet:  No restrictions   Complete by: As directed    Discharge instructions   Complete by: As directed    Follow up with your OB provider at your next ROB   LABOR:  When conractions begin, you should start to time them from the beginning of one contraction to the beginning  of the next.  When contractions are 5 - 10 minutes apart or less and have been regular for at least an hour, you should call your health care provider.   Complete by: As directed    No sexual activity restrictions   Complete by: As directed    Notify physician for bleeding from the vagina   Complete by: As directed    Notify physician for blurring of vision or spots before the eyes   Complete by: As directed    Notify physician for chills or fever   Complete by: As directed    Notify physician for fainting spells, black outs or loss of consciousness   Complete by: As directed    Notify physician for increase in vaginal discharge   Complete by: As directed    Notify physician for leaking of fluid   Complete by: As directed    Notify physician for pain or burning when urinating   Complete by: As directed    Notify physician for pelvic pressure (sudden increase)   Complete by: As directed    Notify physician for severe or continued nausea or vomiting   Complete by: As directed    Notify physician for sudden gushing of fluid from the vagina (with or without continued leaking)   Complete by: As directed    Notify physician for sudden, constant, or occasional abdominal pain   Complete by: As directed    Notify physician if baby moving less than usual   Complete by: As directed       Allergies as of 12/03/2023   Not  on File      Medication List     TAKE these medications    ACCRUFeR  30 MG Caps Generic drug: Ferric Maltol  Take 1 capsule (30 mg total) by mouth in the morning and at bedtime.   aspirin  EC 81 MG tablet Take 1 tablet (81 mg total) by mouth daily. Swallow whole.   famotidine  20 MG tablet Commonly known as: PEPCID  Take 1 tablet (20 mg total) by  mouth 2 (two) times daily.   metroNIDAZOLE  500 MG tablet Commonly known as: FLAGYL  Take 1 tablet (500 mg total) by mouth 2 (two) times daily for 7 days.   ondansetron  4 MG disintegrating tablet Commonly known as: ZOFRAN -ODT Take 1 tablet (4 mg total) by mouth every 8 (eight) hours as needed for nausea or vomiting.   Vitafol  Ultra 29-0.6-0.4-200 MG Caps Take 1 capsule by mouth daily.         Total time spent taking care of this patient: 2 hours  Signed: Lolita Loots CNM, FNP 12/03/2023, 2:00 PM

## 2023-12-03 NOTE — OB Triage Note (Signed)
 Pt is a 33yo G2P1, 38w 1d. She arrived to the unit with complaints of vaginal spotting when she wipes after urination that started this morning. She states that she has had irregular ctx for the past 2 days.  She reports decreased fetal movement, she states is not as much movement and slower movements than normal. She also states that she has had light green mucus discharge for the past week. VS stable, monitors applied and assessing. Initial FHT 140 at 1056  CNM aware of pt's arrival

## 2023-12-05 ENCOUNTER — Other Ambulatory Visit: Payer: Self-pay

## 2023-12-05 ENCOUNTER — Encounter: Payer: Self-pay | Admitting: Obstetrics and Gynecology

## 2023-12-05 ENCOUNTER — Inpatient Hospital Stay: Admitting: Anesthesiology

## 2023-12-05 ENCOUNTER — Other Ambulatory Visit

## 2023-12-05 ENCOUNTER — Ambulatory Visit (INDEPENDENT_AMBULATORY_CARE_PROVIDER_SITE_OTHER): Admitting: Obstetrics

## 2023-12-05 ENCOUNTER — Inpatient Hospital Stay
Admission: EM | Admit: 2023-12-05 | Discharge: 2023-12-07 | DRG: 806 | Disposition: A | Discharge status: Home / Self Care | Attending: Certified Nurse Midwife | Admitting: Certified Nurse Midwife

## 2023-12-05 VITALS — BP 122/82 | HR 105 | Wt 178.7 lb

## 2023-12-05 DIAGNOSIS — O9832 Other infections with a predominantly sexual mode of transmission complicating childbirth: Secondary | ICD-10-CM | POA: Diagnosis present

## 2023-12-05 DIAGNOSIS — O09893 Supervision of other high risk pregnancies, third trimester: Secondary | ICD-10-CM | POA: Diagnosis not present

## 2023-12-05 DIAGNOSIS — O99713 Diseases of the skin and subcutaneous tissue complicating pregnancy, third trimester: Secondary | ICD-10-CM | POA: Diagnosis not present

## 2023-12-05 DIAGNOSIS — O99892 Other specified diseases and conditions complicating childbirth: Secondary | ICD-10-CM | POA: Diagnosis present

## 2023-12-05 DIAGNOSIS — O9902 Anemia complicating childbirth: Principal | ICD-10-CM | POA: Diagnosis present

## 2023-12-05 DIAGNOSIS — O9972 Diseases of the skin and subcutaneous tissue complicating childbirth: Secondary | ICD-10-CM | POA: Diagnosis not present

## 2023-12-05 DIAGNOSIS — Z3A38 38 weeks gestation of pregnancy: Secondary | ICD-10-CM | POA: Diagnosis not present

## 2023-12-05 DIAGNOSIS — L930 Discoid lupus erythematosus: Secondary | ICD-10-CM | POA: Diagnosis not present

## 2023-12-05 DIAGNOSIS — O0990 Supervision of high risk pregnancy, unspecified, unspecified trimester: Secondary | ICD-10-CM

## 2023-12-05 DIAGNOSIS — Z833 Family history of diabetes mellitus: Secondary | ICD-10-CM | POA: Diagnosis not present

## 2023-12-05 DIAGNOSIS — L932 Other local lupus erythematosus: Secondary | ICD-10-CM | POA: Diagnosis not present

## 2023-12-05 DIAGNOSIS — Z87891 Personal history of nicotine dependence: Secondary | ICD-10-CM

## 2023-12-05 DIAGNOSIS — M329 Systemic lupus erythematosus, unspecified: Secondary | ICD-10-CM | POA: Diagnosis present

## 2023-12-05 DIAGNOSIS — Z8249 Family history of ischemic heart disease and other diseases of the circulatory system: Secondary | ICD-10-CM | POA: Diagnosis not present

## 2023-12-05 DIAGNOSIS — Z7982 Long term (current) use of aspirin: Secondary | ICD-10-CM

## 2023-12-05 DIAGNOSIS — N898 Other specified noninflammatory disorders of vagina: Secondary | ICD-10-CM

## 2023-12-05 DIAGNOSIS — O26893 Other specified pregnancy related conditions, third trimester: Secondary | ICD-10-CM | POA: Diagnosis present

## 2023-12-05 LAB — CBC
HCT: 35.3 % — ABNORMAL LOW (ref 36.0–46.0)
Hemoglobin: 11.6 g/dL — ABNORMAL LOW (ref 12.0–15.0)
MCH: 28.4 pg (ref 26.0–34.0)
MCHC: 32.9 g/dL (ref 30.0–36.0)
MCV: 86.3 fL (ref 80.0–100.0)
Platelets: 264 K/uL (ref 150–400)
RBC: 4.09 MIL/uL (ref 3.87–5.11)
RDW: 16.2 % — ABNORMAL HIGH (ref 11.5–15.5)
WBC: 19.4 K/uL — ABNORMAL HIGH (ref 4.0–10.5)
nRBC: 0 % (ref 0.0–0.2)

## 2023-12-05 LAB — TYPE AND SCREEN
ABO/RH(D): O POS
Antibody Screen: NEGATIVE

## 2023-12-05 MED ORDER — LIDOCAINE-EPINEPHRINE (PF) 1.5 %-1:200000 IJ SOLN
INTRAMUSCULAR | Status: DC | PRN
  Administered 2023-12-05 (×2): 3 mL via EPIDURAL

## 2023-12-05 MED ORDER — OXYTOCIN 10 UNIT/ML IJ SOLN
INTRAMUSCULAR | Status: AC
  Filled 2023-12-05: qty 2

## 2023-12-05 MED ORDER — FENTANYL-BUPIVACAINE-NACL 0.5-0.125-0.9 MG/250ML-% EP SOLN
EPIDURAL | Status: AC
  Filled 2023-12-05: qty 250

## 2023-12-05 MED ORDER — ONDANSETRON HCL 4 MG/2ML IJ SOLN
4.0000 mg | Freq: Four times a day (QID) | INTRAMUSCULAR | Status: DC | PRN
  Administered 2023-12-05 (×2): 4 mg via INTRAVENOUS
  Filled 2023-12-05: qty 2

## 2023-12-05 MED ORDER — ACETAMINOPHEN 325 MG PO TABS
650.0000 mg | ORAL_TABLET | ORAL | Status: DC | PRN
  Administered 2023-12-06: 650 mg via ORAL
  Filled 2023-12-05: qty 2

## 2023-12-05 MED ORDER — PHENYLEPHRINE 80 MCG/ML (10ML) SYRINGE FOR IV PUSH (FOR BLOOD PRESSURE SUPPORT): 80.0000 ug | PREFILLED_SYRINGE | INTRAVENOUS | Status: DC | PRN

## 2023-12-05 MED ORDER — EPHEDRINE 5 MG/ML INJ: 10.0000 mg | INTRAVENOUS | Status: DC | PRN

## 2023-12-05 MED ORDER — BUPIVACAINE HCL (PF) 0.25 % IJ SOLN
INTRAMUSCULAR | Status: DC | PRN
  Administered 2023-12-05 (×2): 8 mL via EPIDURAL

## 2023-12-05 MED ORDER — FENTANYL-BUPIVACAINE-NACL 0.5-0.125-0.9 MG/250ML-% EP SOLN
12.0000 mL/h | EPIDURAL | Status: DC | PRN
  Administered 2023-12-05 (×2): 12 mL/h via EPIDURAL

## 2023-12-05 MED ORDER — LACTATED RINGERS IV SOLN: 500.0000 mL | INTRAVENOUS | Status: DC | PRN

## 2023-12-05 MED ORDER — LACTATED RINGERS IV SOLN: INTRAVENOUS | Status: DC

## 2023-12-05 MED ORDER — LIDOCAINE HCL (PF) 1 % IJ SOLN
INTRAMUSCULAR | Status: DC
Start: 2023-12-05 — End: 2023-12-06
  Filled 2023-12-05: qty 30

## 2023-12-05 MED ORDER — MISOPROSTOL 200 MCG PO TABS
ORAL_TABLET | ORAL | Status: AC
  Filled 2023-12-05: qty 4

## 2023-12-05 MED ORDER — OXYTOCIN BOLUS FROM INFUSION: 333.0000 mL | Freq: Once | INTRAVENOUS | Status: DC

## 2023-12-05 MED ORDER — OXYTOCIN-SODIUM CHLORIDE 30-0.9 UT/500ML-% IV SOLN
2.5000 [IU]/h | INTRAVENOUS | Status: DC
  Filled 2023-12-05: qty 500

## 2023-12-05 MED ORDER — AMMONIA AROMATIC IN INHA
RESPIRATORY_TRACT | Status: DC
Start: 2023-12-05 — End: 2023-12-06
  Filled 2023-12-05: qty 10

## 2023-12-05 MED ORDER — LACTATED RINGERS IV SOLN: 500.0000 mL | Freq: Once | INTRAVENOUS | Status: DC

## 2023-12-05 MED ORDER — LIDOCAINE HCL (PF) 1 % IJ SOLN: 30.0000 mL | INTRAMUSCULAR | Status: DC | PRN

## 2023-12-05 MED ORDER — DIPHENHYDRAMINE HCL 50 MG/ML IJ SOLN: 12.5000 mg | INTRAMUSCULAR | Status: DC | PRN

## 2023-12-05 NOTE — H&P (Signed)
 OB History & Physical   History of Present Illness:  Chief Complaint: contractions  HPI:  Michelle Massey is a 33 y.o. G17P1001 female at [redacted]w[redacted]d dated by LMP.  Her pregnancy has been complicated by cutaneous lupus erythematosus, hx delayed postpartum hemorrhage, ASCUS/HR HPV PAP.    She reports contractions since 5:30 PM.   She denies leakage of fluid.   She denies vaginal bleeding.   She reports fetal movement.    Total weight gain for pregnancy: 13.9 kg   Obstetrical Problem List: January 2025 Problems (from 05/18/23 to present)     Problem Noted Diagnosed Resolved   Vaginal inclusion cyst 11/13/2023 by Delinda Jinnie Jansky, CNM  No   Supervision of high risk pregnancy, antepartum 05/18/2023 by Alaina Waddell SAUNDERS, CMA  No   Overview Addendum 11/20/2023 11:17 AM by Delinda Jinnie Jansky, CNM   Clinical Staff Provider  Office Location  [facility] Dating  12/16/2023, by Last Menstrual Period  Language  English Anatomy US   Normal   Flu Vaccine  no Genetic Screen  NIPS: neg  TDaP vaccine   Given:09/28/23 Hgb A1C or  GTT Early : Third trimester : 161, normal 3 hour   Covid declined   LAB RESULTS   Rhogam  O/Positive/-- (02/14 1405)  Blood Type O/Positive/-- (02/14 1405)   RSV  Antibody Negative (02/14 1405)  Feeding Plan breast Rubella 3.94 (02/14 1405)  Contraception unknown RPR Non Reactive (02/14 1405)   Circumcision yes HBsAg Negative (02/14 1405)   Pediatrician  [facility] HIV Non Reactive (02/14 1405)  Support Person Dylan Loftin, father of baby. Jon Handler, mom Varicella Reactive (02/14 1405)  Prenatal Classes no GBS  (For PCN allergy, check sensitivities)     Hep C     BTL Consent  Pap Diagnosis  Date Value Ref Range Status  06/08/2023 (A)  Final   - Atypical squamous cells, cannot exclude high grade squamous  06/08/2023 intraepithelial lesion (ASC-H) (A)  Final    VBAC Consent  Hgb Electro      CF      SMA                    Maternal Medical History:    Past Medical History:  Diagnosis Date   Collagen vascular disease (HCC)    Lupus    Scoliosis     Past Surgical History:  Procedure Laterality Date   NO PAST SURGERIES      Not on File  Prior to Admission medications   Medication Sig Start Date End Date Taking? Authorizing Provider  aspirin  EC 81 MG tablet Take 1 tablet (81 mg total) by mouth daily. Swallow whole. 06/08/23  Yes Dominic, Jinnie Jansky, CNM  famotidine  (PEPCID ) 20 MG tablet Take 1 tablet (20 mg total) by mouth 2 (two) times daily. 11/03/23  Yes Viviann Pastor, MD  Ferric Maltol  (ACCRUFER ) 30 MG CAPS Take 1 capsule (30 mg total) by mouth in the morning and at bedtime. 10/01/23  Yes Free, Lauraine PARAS, CNM  metroNIDAZOLE  (FLAGYL ) 500 MG tablet Take 1 tablet (500 mg total) by mouth 2 (two) times daily for 7 days. 12/03/23 12/10/23 Yes Duwayne Maiden, CNM  Prenat-Fe Poly-Methfol-FA-DHA (VITAFOL  ULTRA) 29-0.6-0.4-200 MG CAPS Take 1 capsule by mouth daily. 05/07/23  Yes Dominic, Jinnie Jansky, CNM  ondansetron  (ZOFRAN -ODT) 4 MG disintegrating tablet Take 1 tablet (4 mg total) by mouth every 8 (eight) hours as needed for nausea or vomiting. Patient not taking: Reported on 12/05/2023 08/03/23  [place], [place], CNM    OB History  Gravida Para Term Preterm AB Living  2 1 1  0 0 1  SAB IAB Ectopic Multiple Live Births  0 0 0 0 1    # Outcome Date GA Lbr Len/2nd Weight Sex Type Anes PTL Lv  2 Current           1 Term 04/26/20 [redacted]w[redacted]d 16:50 / 00:42 3530 g M Vag-Spont EPI  LIV    Prenatal care site: [facility]  Social History: She  reports that she quit smoking about 5 years ago. Her smoking use included cigarettes. She started smoking about 12 years ago. She has a 3.4 pack-year smoking history. She has never used smokeless tobacco. She reports that she does not currently use alcohol. She reports that she does not currently use drugs after having used the following drugs: Marijuana.  Family History: family history includes Asthma in her  mother; Bipolar disorder in her sister; Cancer in her mother; Diabetes in her maternal great-grandfather; Hodgkin's lymphoma in her mother; Hypertension in her maternal great-grandfather and mother; Schizophrenia in her sister.    Review of Systems:  Review of Systems  Constitutional:  Negative for chills and fever.  HENT:  Negative for congestion, ear discharge, ear pain, hearing loss, sinus pain and sore throat.   Eyes:  Negative for blurred vision and double vision.  Respiratory:  Negative for cough, shortness of breath and wheezing.   Cardiovascular:  Negative for chest pain, palpitations and leg swelling.  Gastrointestinal:  Positive for abdominal pain. Negative for blood in stool, constipation, diarrhea, heartburn, melena, nausea and vomiting.  Genitourinary:  Negative for dysuria, flank pain, frequency, hematuria and urgency.  Musculoskeletal:  Negative for back pain, joint pain and myalgias.  Skin:  Negative for itching and rash.  Neurological:  Negative for dizziness, tingling, tremors, sensory change, speech change, focal weakness, seizures, loss of consciousness, weakness and headaches.  Endo/Heme/Allergies:  Negative for environmental allergies. Does not bruise/bleed easily.  Psychiatric/Behavioral:  Negative for depression, hallucinations, memory loss, substance abuse and suicidal ideas. The patient is not nervous/anxious and does not have insomnia.      Physical Exam:  BP 118/78 (BP Location: Right Arm)   Pulse 96   Temp 98.3 F (36.8 C) (Oral)   Resp 18   Ht 5' 6 (1.676 m)   Wt 81.1 kg   LMP 03/11/2023   BMI 28.84 kg/m   Constitutional: Well nourished, well developed female in mild acute distress.  HEENT: normal Skin: Warm and dry.  Cardiovascular: Regular rate and rhythm.   Extremity: no edema  Respiratory: Clear to auscultation bilateral. Normal respiratory effort Abdomen: FHT present Back: no CVAT Psych: Alert and Oriented x3. No memory deficits. Normal mood  and affect.    Pelvic exam: per RN 6/80/0    Baseline FHR: 130 beats/min   Variability: moderate   Accelerations: present   Decelerations: absent Contractions: present frequency: Q 3-4 minutes Overall assessment: reassuring    Lab Results  Component Value Date   SARSCOV2NAA NEGATIVE 04/21/2022    Assessment:  Michelle Massey is a 33 y.o. G50P1001 female at [redacted]w[redacted]d with active labor.   Plan:  Admit to Labor & Delivery  CBC, T&S, Clrs, IVF GBS negative.   Fetal well-being: Category I Expectant management for vaginal delivery    Slater Rains, [facility] 12/05/2023 9:13 PM

## 2023-12-05 NOTE — Anesthesia Procedure Notes (Signed)
 Epidural Patient location during procedure: OB Start time: 12/05/2023 9:53 PM End time: 12/05/2023 9:59 PM  Staffing Anesthesiologist: Shellie Odor, MD Performed: anesthesiologist   Preanesthetic Checklist Completed: patient identified, IV checked, risks and benefits discussed, surgical consent, monitors and equipment checked, pre-op evaluation and timeout performed  Epidural Patient position: sitting Prep: ChloraPrep Patient monitoring: heart rate, continuous pulse ox and blood pressure Approach: midline Location: L2-L3 Injection technique: LOR air  Needle:  Needle type: Tuohy  Needle gauge: 17 G Needle length: 9 cm Needle insertion depth: 4 cm Catheter at skin depth: 9 cm Test dose: negative and 1.5% lidocaine  with Epi 1:200 K  Assessment Sensory level: T4  Additional Notes Straightforward placement without apparent complications. Reason for block:procedure for pain

## 2023-12-05 NOTE — Anesthesia Preprocedure Evaluation (Signed)
 Anesthesia Evaluation  Patient identified by MRN, date of birth, ID band Patient awake    Reviewed: Allergy & Precautions, NPO status , Patient's Chart, lab work & pertinent test results  History of Anesthesia Complications Negative for: history of anesthetic complications  Airway Mallampati: II   Neck ROM: Full    Dental   Pulmonary former smoker   Pulmonary exam normal breath sounds clear to auscultation       Cardiovascular negative cardio ROS Normal cardiovascular exam Rhythm:Regular Rate:Normal     Neuro/Psych negative neurological ROS     GI/Hepatic negative GI ROS,,,  Endo/Other  negative endocrine ROS    Renal/GU negative Renal ROS     Musculoskeletal Lupus    Abdominal   Peds  Hematology  (+) Blood dyscrasia, anemia   Anesthesia Other Findings 33 yo G2P1001 at 58 3/7 requesting labor epidural.  Reproductive/Obstetrics (+) Pregnancy                              Anesthesia Physical Anesthesia Plan  ASA: 2  Anesthesia Plan: Epidural   Post-op Pain Management:    Induction:   PONV Risk Score and Plan: 2 and Treatment may vary due to age or medical condition  Airway Management Planned: Natural Airway  Additional Equipment:   Intra-op Plan:   Post-operative Plan:   Informed Consent: I have reviewed the patients History and Physical, chart, labs and discussed the procedure including the risks, benefits and alternatives for the proposed anesthesia with the patient or authorized representative who has indicated his/her understanding and acceptance.     Dental Advisory Given  Plan Discussed with:   Anesthesia Plan Comments: (Patient reports no bleeding problems and no anticoagulant use.   Patient consented for risks of anesthesia including but not limited to:  - adverse reactions to medications - risk of bleeding, infection and or nerve damage from epidural that  could lead to paralysis - risk of headache or failed epidural - nerve damage due to positioning - that if epidural is used for C-section that there is a chance of epidural failure requiring spinal placement or conversion to GA - damage to heart, brain, lungs, other parts of body or loss of life  Patient voiced understanding and assent.)        Anesthesia Quick Evaluation

## 2023-12-05 NOTE — Progress Notes (Signed)
    Return Prenatal Note   Subjective  33 y.o. G2P1001 at [redacted]w[redacted]d presents for this follow-up prenatal visit.   Patient was seen in L&D yesterday for vaginal spotting.  Tested positive for BV and treated with Flagyl , is taking and tolerating fine. States she was 3cm yesterday when they checked her. No further spotting. NST done yesterday - reactive and reassuring.  Patient has scheduled IOL on 8/17 for her lupus. Has questions about IOL and declines cervical exam today.  Patient reports: Movement: Present Contractions: Irregular Denies vaginal bleeding or leaking fluid. Objective  Flow sheet Vitals: Pulse Rate: (!) 105 BP: 122/82 Fundal Height: 38 cm Fetal Heart Rate (bpm): 132 Total weight gain: 30 lb 11.2 oz (13.9 kg)  General Appearance  No acute distress, well appearing, and well nourished Pulmonary   Normal work of breathing Neurologic   Alert and oriented to person, place, and time Psychiatric   Mood and affect within normal limits   Assessment/Plan   Plan  33 y.o. G2P1001 at [redacted]w[redacted]d presents for follow-up OB visit. Reviewed prenatal record including previous visit note. 1. Supervision of high risk pregnancy, antepartum (Primary)  2. Cutaneous lupus erythematosus Pt currently well and was 3cm yesterday in OBT, dx with BV for spotting and is currently on Flagyl .  Scheduled for IOL on 8/17, labor precautions reviewed. IOL plan of care discussed, dependent on cervical effacement, but if she was 3cm yesterday, we reviewed unlikely she will need Cook's, which she was fearful of. CNM to determine Misoprostol  vs Pitocin  on admission and she is ok with this.   Future Appointments  Date Time Provider Department Center  02/28/2024  9:00 AM Scoggins, Jeoffrey, NP AMA-[facility] None  06/02/2024  9:15 AM Jackquline Sawyer, MD ASC-ASC None    For next visit:  IOL on 12/09/23, 8:00a    Estil Mangle, DO [facility]

## 2023-12-05 NOTE — Progress Notes (Signed)
  Labor Progress Note   33 y.o. G2P1001 @ [redacted]w[redacted]d , admitted for  Pregnancy, Labor Management. Contractions/active labor  Subjective:  Comfortable now with epidural  Objective:  BP 124/77   Pulse (!) 117   Temp 98.3 F (36.8 C) (Oral)   Resp 18   Ht 5' 6 (1.676 m)   Wt 81.1 kg   LMP 03/11/2023   BMI 28.84 kg/m  Abd: gravid, ND, FHT present, mild tenderness on exam Extr: no edema SVE: CERVIX: 7 cm dilated, 90 effaced, +1 station AROM clear/pink large amount  EFM: FHR: 130 bpm, variability: moderate,  accelerations:  Present,  decelerations:  Absent Toco: Frequency: Every 1-2 minutes Labs: I have reviewed the patient's lab results.   Assessment & Plan:  G2P1001 @ [redacted]w[redacted]d, admitted for  Pregnancy and Labor/Delivery Management  1. Pain management: epidural. 2. FWB: FHT category I.  3. ID: GBS negative 4. Labor management: anticipate vaginal delivery  All discussed with patient, see orders   Slater Rains, CNM [facility] 12/05/2023 11:03 PM

## 2023-12-05 NOTE — OB Triage Note (Signed)
 Pt is a 33yo G2P1, 38w 3d. She arrived to the unit with complaints of contractions. She states that the ctx started around 1730, q31mins, and are 8/10 on pain scale she reports. She denies vaginal bleeding, reports positive fetal movement. VS stable, monitors applied and assessing. Initial FHT 135 at 1837.  Plan to labor eval.

## 2023-12-06 ENCOUNTER — Encounter: Payer: Self-pay | Admitting: Advanced Practice Midwife

## 2023-12-06 DIAGNOSIS — L930 Discoid lupus erythematosus: Secondary | ICD-10-CM

## 2023-12-06 DIAGNOSIS — O09893 Supervision of other high risk pregnancies, third trimester: Secondary | ICD-10-CM

## 2023-12-06 DIAGNOSIS — Z3A38 38 weeks gestation of pregnancy: Secondary | ICD-10-CM

## 2023-12-06 DIAGNOSIS — O9972 Diseases of the skin and subcutaneous tissue complicating childbirth: Secondary | ICD-10-CM | POA: Diagnosis not present

## 2023-12-06 LAB — CBC
HCT: 30.7 % — ABNORMAL LOW (ref 36.0–46.0)
Hemoglobin: 9.9 g/dL — ABNORMAL LOW (ref 12.0–15.0)
MCH: 27.7 pg (ref 26.0–34.0)
MCHC: 32.2 g/dL (ref 30.0–36.0)
MCV: 85.8 fL (ref 80.0–100.0)
Platelets: 234 K/uL (ref 150–400)
RBC: 3.58 MIL/uL — ABNORMAL LOW (ref 3.87–5.11)
RDW: 15.9 % — ABNORMAL HIGH (ref 11.5–15.5)
WBC: 18.1 K/uL — ABNORMAL HIGH (ref 4.0–10.5)
nRBC: 0 % (ref 0.0–0.2)

## 2023-12-06 LAB — RPR: RPR Ser Ql: NONREACTIVE

## 2023-12-06 MED ORDER — WITCH HAZEL-GLYCERIN EX PADS
1.0000 | MEDICATED_PAD | CUTANEOUS | Status: DC | PRN
Start: 2023-12-06 — End: 2023-12-07
  Filled 2023-12-06: qty 100

## 2023-12-06 MED ORDER — DIBUCAINE (PERIANAL) 1 % EX OINT: 1.0000 | TOPICAL_OINTMENT | CUTANEOUS | Status: DC | PRN

## 2023-12-06 MED ORDER — ONDANSETRON HCL 4 MG/2ML IJ SOLN: 4.0000 mg | INTRAMUSCULAR | Status: DC | PRN

## 2023-12-06 MED ORDER — SIMETHICONE 80 MG PO CHEW: 80.0000 mg | CHEWABLE_TABLET | ORAL | Status: DC | PRN

## 2023-12-06 MED ORDER — BENZOCAINE-MENTHOL 20-0.5 % EX AERO
1.0000 | INHALATION_SPRAY | CUTANEOUS | Status: DC | PRN
  Filled 2023-12-06: qty 56

## 2023-12-06 MED ORDER — IBUPROFEN 600 MG PO TABS
600.0000 mg | ORAL_TABLET | Freq: Four times a day (QID) | ORAL | Status: DC
  Administered 2023-12-06 – 2023-12-07 (×5): 600 mg via ORAL
  Filled 2023-12-06 (×6): qty 1

## 2023-12-06 MED ORDER — ONDANSETRON HCL 4 MG PO TABS: 4.0000 mg | ORAL_TABLET | ORAL | Status: DC | PRN

## 2023-12-06 MED ORDER — SENNOSIDES-DOCUSATE SODIUM 8.6-50 MG PO TABS
2.0000 | ORAL_TABLET | Freq: Every day | ORAL | Status: DC
  Administered 2023-12-07: 2 via ORAL
  Filled 2023-12-06: qty 2

## 2023-12-06 MED ORDER — PRENATAL MULTIVITAMIN CH
1.0000 | ORAL_TABLET | Freq: Every day | ORAL | Status: DC
  Administered 2023-12-06 – 2023-12-07 (×2): 1 via ORAL
  Filled 2023-12-06 (×2): qty 1

## 2023-12-06 MED ORDER — ACETAMINOPHEN 325 MG PO TABS
650.0000 mg | ORAL_TABLET | ORAL | Status: DC | PRN
  Administered 2023-12-06: 650 mg via ORAL
  Filled 2023-12-06: qty 2

## 2023-12-06 MED ORDER — COCONUT OIL OIL
1.0000 | TOPICAL_OIL | Status: DC | PRN
  Filled 2023-12-06: qty 22.5

## 2023-12-06 MED ORDER — DIPHENHYDRAMINE HCL 25 MG PO CAPS: 25.0000 mg | ORAL_CAPSULE | Freq: Four times a day (QID) | ORAL | Status: DC | PRN

## 2023-12-06 NOTE — Clinical Social Work Maternal (Signed)
 CLINICAL SOCIAL WORK MATERNAL/CHILD NOTE  Patient Details  Name: Michelle Massey MRN: [id number] Date of Birth: 14-Nov-1990  Date:  12/06/2023  Clinical Social Worker Initiating Note:  Corrie Ruts Date/Time: Initiated:  12/06/23/1130     Child's Name:  Michelle Massey   Biological Parents:  Mother, Father   Need for Interpreter:  None   Reason for Referral:  Current Substance Use/Substance Use During Pregnancy     Address:  [address] [place] [address]    Phone number:  [phone] (home)     Additional phone number:   Household Members/Support Persons (HM/SP):   Household Member/Support Person 1, Household Member/Support Person 2, Household Member/Support Person 3   HM/SP Name Relationship DOB or Age  HM/SP -1 Michelle Massey Mother of the patient 35  HM/SP -2 Michelle Massey Brother of the patient 4  HM/SP -3 Michelle Massey Brother of the patient 43  HM/SP -4        HM/SP -5        HM/SP -6        HM/SP -7        HM/SP -8          Natural Supports (not living in the home):  Spouse/significant other   Professional Supports:     Employment: Unemployed   Type of Work:     Education:  9 to 11 years   Homebound arranged: No  Financial Resources:  [employer]   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 I spoke with Olam Arenas and the FOB, Dylan  Loftin at bed side. I introduced myself, my role, reason for consult, and HIPAA. The patient allowed the FOB to stay in the room during the consult. The patient reports feeling fine after birth. Ms. Royse confirmed that her address is [address], [place] [address] and her telephone number is [phone]. The patient reports having support from her mother. The patient reports that her highest level of education is 11th grade and she is currently unemployed. The patient confirms that she receives [facility] and [employer]. The patient reports that she lives in the home with her mother, Michelle Massey (40), her brother, Michelle Rympdupjw(72), and brother, Michelle Massey (27). The patient denies mental health history. The patient denies any past and current SI/HI/DV during the consult.   I informed the patient about Post Partum Depression, Infant death Syndrome, Psychiatrist, Safe Sleep Environment, and Marijuana Use while breat feeding. The patient verbalized understanding.  The patient confirmed that she will use [facility] for the baby medical appointments. The patient reports that she has a crib, bassinet, pack and play, clothes, and car seat.   The patient confirms that the FOB will help during  discharge. The patient will breast feed and has a breast pump.   I then explained the law and hospital policy for Drug Exposure to a child. I explained that if the baby cord blood is positive for THC then CPS will have to be notified. The patient verbalized understanding.   I will monitor baby cord blood for THC and make the report to CPS with the results.   CSW Plan/Description:  CSW Will Continue to Monitor Umbilical Cord Tissue Drug Screen Results and Make Report if [facility], [facility] Drug Screen Policy Information, Neonatal Abstinence Syndrome (NAS) Education, Perinatal Mood and Anxiety Disorder (PMADs) Education, Sudden Infant Death Syndrome (SIDS) Education    Corrie JINNY Ruts,  LCSW 12/06/2023, 3:00 PM

## 2023-12-06 NOTE — Discharge Summary (Signed)
 OB Discharge Summary     Patient Name: Michelle Massey DOB: 23-Jul-1990 MRN: [id number]  Date of admission: 12/05/2023 Delivering provider: Slater Rains, CNM  Date of Delivery: 12/06/2023  Date of discharge: 12/07/2023  Admitting diagnosis: Labor and delivery, indication for care [O75.9] Intrauterine pregnancy: [redacted]w[redacted]d     Secondary diagnosis: systemic lupus     Discharge diagnosis: Term Pregnancy Delivered                                                                                                Post partum procedures:none  Augmentation: AROM  Complications: None  Hospital course:  Onset of Labor With Vaginal Delivery      33 y.o. yo [id number] at [redacted]w[redacted]d was admitted in Active Labor on 12/05/2023. Labor course was complicated by NA  Membrane Rupture Time/Date: 10:41 PM,12/05/2023  Delivery Method:Vaginal, Spontaneous Operative Delivery:N/A Episiotomy: None Lacerations:  none   Pt. Is eating, hydrating, and voiding regularly without difficulty. Has had BM without difficulty. She is primarily bottle feeding, had difficulty latching baby and hx of low milk supply with previous baby, most likely plans to exclusively bottle feed formula. Reports small amount of vaginal bleeding, denies passing large blood clots. Has had cramping abdomen pain relieved with tylenol /ibuprofen . Plans to use POPs for contraception. Denies anxiety/depression symptoms. Endorses good support from partner and family.     Patient is discharged home in stable condition on 12/07/23.  Newborn Data: Birth date:12/06/2023 Birth time:12:49 AM Gender:Female Living status:Living Apgars:8 ,8  Weight:4160 g  Physical exam  Vitals:   12/06/23 1615 12/06/23 1958 12/06/23 2253 12/07/23 0815  BP: 117/64 121/77 112/62 116/77  Pulse: 84 (!) 107 83 98  Resp: 18 18 18 20   Temp: 98.2 F (36.8 C) 97.8 F (36.6 C) 98.5 F (36.9 C) 98 F (36.7 C)  TempSrc: Oral Oral Oral Oral  SpO2: 99% 98% 98% 100%  Weight:      Height:        General: alert, cooperative, and no distress Lochia: appropriate Uterine Fundus: firm Incision: N/A DVT Evaluation: No evidence of DVT seen on physical exam. Negative Homan's sign. No cords or calf tenderness.  Labs: Lab Results  Component Value Date   WBC 18.1 (H) 12/06/2023   HGB 9.9 (L) 12/06/2023   HCT 30.7 (L) 12/06/2023   MCV 85.8 12/06/2023   PLT 234 12/06/2023    Discharge instruction: per After Visit Summary.  Medications:  Allergies as of 12/07/2023   Not on File      Medication List     STOP taking these medications    aspirin  EC 81 MG tablet   famotidine  20 MG tablet Commonly known as: PEPCID    ondansetron  4 MG disintegrating tablet Commonly known as: ZOFRAN -ODT       TAKE these medications    ACCRUFeR  30 MG Caps Generic drug: Ferric Maltol  Take 1 capsule (30 mg total) by mouth in the morning and at bedtime.   metroNIDAZOLE  500 MG tablet Commonly known as: FLAGYL  Take 1 tablet (500 mg total) by mouth 2 (two) times daily for 7 days.   Opill   0.075 MG Tabs Generic drug: Norgestrel  Take 1 tablet by mouth daily.   Vitafol  Ultra 29-0.6-0.4-200 MG Caps Take 1 capsule by mouth daily.        Diet: routine diet  Activity: Advance as tolerated. Pelvic rest for 6 weeks.   Outpatient follow up:  Follow-up Information     Lynda Bradley, CNM. Schedule an appointment as soon as possible for a visit.   Specialty: Obstetrics Why: 2 week virtual and 6 week in office postpartum visits Contact information: [address] [place] [address] [phone]                   Postpartum contraception: Progesterone only pills Rhogam Given postpartum: Rh positive Rubella vaccine given postpartum: immune Varicella vaccine given postpartum: immune TDaP given antepartum or postpartum: given antepartum    Newborn Delivery   Birth date/time: 12/06/2023 00:49:00 Delivery type: Vaginal, Spontaneous      Baby Feeding:  Breast  Disposition:home with mother  SIGNED:  Lolita Loots, CNM 12/07/2023 3:20 PM

## 2023-12-06 NOTE — Lactation Note (Signed)
 This note was copied from a baby's chart. Lactation Consultation Note  Patient Name: Michelle Massey Unijb'd Date: 12/06/2023 Age:35 hours Reason for consult: Initial assessment;Early term 37-38.6wks;Breastfeeding assistance   Maternal Data Does the patient have breastfeeding experience prior to this delivery?: Yes How long did the patient breastfeed?: 1 month  Initial assessment w/ a 8hr old baby Michelle and P2 patient.  This was a SVD.  No major factors affecting lactation at the time.  Patient stated that she nursed first child for 1 month but had a hard time keeping her supply up. Mom does have a portable breastpump at home.  Infant is currently having blood sugars checked.  Feeding Mother's Current Feeding Choice: Breast Milk and Formula Nipple Type: Slow - flow  LC assisted patient with an attempted feeding at the breast.  Infant showed no interest.    A DEBP was set up had patient pump w/ 21mm flanges. Small drops of colostrum seen in both breast.  FOB feeding infant w/ 24kcal formula.   Lactation Tools Discussed/Used A DEBP set up in patients room with education on how to use pump.  Interventions Interventions: Breast feeding basics reviewed;Assisted with latch;DEBP;Education;Pace feeding  LC provided education on the following;  milk production expectations, hunger cues, day 1/2 wet/dirty diapers, hand expression, cluster feeding, benefits of STS and arousing infant for a feeding.  Lactation informed patient of feeding infant at least 8 or more times w/in a 24hr period but not exceeding 3hrs. Patient verbalized understanding.   Discharge Pump: Personal;Hands Rex Oesterle  Consult Status Consult Status: Follow-up Follow-up type: In-patient    Butler Vegh S Ahron Hulbert 12/06/2023, 10:19 AM

## 2023-12-07 MED ORDER — OPILL 0.075 MG PO TABS: 1.0000 | ORAL_TABLET | Freq: Every day | ORAL | 11 refills | Status: DC

## 2023-12-07 NOTE — Lactation Note (Signed)
 This note was copied from a baby's chart. Lactation Consultation Note  Patient Name: Michelle Massey Date: 12/07/2023 Age:35 hours Reason for consult: Follow-up assessment;Early term 37-38.6wks;Maternal discharge   Maternal Data Does the patient have breastfeeding experience prior to this delivery?: Yes How long did the patient breastfeed?: 1 mth  Feeding Mother's Current Feeding Choice: Formula Nipple Type: Slow - flow Mom states she has decided to only formula feed, she does not want to pump her breasts or attempt breastfeeding anymore.  She has [facility] and will visit [facility] office in [place] on Monday, Aug. 18, 2025  [facility] Score                    Lactation Tools Discussed/Used    Interventions   LC name updated on white board to call for questions or concerns Discharge Discharge Education: Engorgement and breast care Pump: Personal WIC Program: Yes  Consult Status Consult Status: Complete (mother declined follow up) Date: 12/07/23 Follow-up type: In-patient    [name] 12/07/2023, 3:52 PM

## 2023-12-07 NOTE — Progress Notes (Signed)
 Discharge information reviewed with patient and significant other.  Questions answered and follow up care reviewed.  Printed copies given to patient for reference after discharge home.

## 2023-12-07 NOTE — Anesthesia Postprocedure Evaluation (Signed)
 Anesthesia Post Note  Patient: Michelle Massey  Procedure(s) Performed: AN AD HOC LABOR EPIDURAL  Patient location during evaluation: Mother Baby Anesthesia Type: Epidural Level of consciousness: awake and alert Pain management: pain level controlled Vital Signs Assessment: post-procedure vital signs reviewed and stable Respiratory status: respiratory function stable Cardiovascular status: stable Postop Assessment: no headache, no backache, able to ambulate, adequate PO intake, patient able to bend at knees and no apparent nausea or vomiting Anesthetic complications: no   No notable events documented.   Last Vitals:  Vitals:   12/06/23 2253 12/07/23 0815  BP: 112/62 116/77  Pulse: 83 98  Resp: 18 20  Temp: 36.9 C 36.7 C  SpO2: 98% 100%    Last Pain:  Vitals:   12/07/23 0815  TempSrc: Oral  PainSc:                  Lorriane Romero FALCON

## 2023-12-07 NOTE — Plan of Care (Signed)

## 2023-12-08 ENCOUNTER — Encounter: Payer: Self-pay | Admitting: Obstetrics

## 2024-01-16 ENCOUNTER — Ambulatory Visit: Admitting: Advanced Practice Midwife

## 2024-01-16 ENCOUNTER — Encounter: Payer: Self-pay | Admitting: Advanced Practice Midwife

## 2024-01-16 NOTE — Progress Notes (Signed)
 Post Partum Visit Note  Michelle Massey is a 33 y.o. [id number] female who presents for a postpartum visit. She is 6 weeks postpartum following a normal spontaneous vaginal delivery.  I have fully reviewed the prenatal and intrapartum course. The delivery was at 38.4 gestational weeks.  Anesthesia: epidural. Postpartum course has been normal. Baby is doing well. Baby is feeding by bottle - Similac Advance. Bleeding no bleeding. Bowel function is normal. Bladder function is normal. Patient was sexually active one time and used condom. Plans to start progesterone only pills. Postpartum depression screening: negative.   Upstream - 01/16/24 1545       Pregnancy Intention Screening   Does the patient want to become pregnant in the next year? No    Does the patient's partner want to become pregnant in the next year? No    Would the patient like to discuss contraceptive options today? Yes      Contraception Wrap Up   Current Method No Method - Other Reason    End Method Oral Contraceptive    Contraception Counseling Provided Yes         The pregnancy intention screening data noted above was reviewed. Potential methods of contraception were discussed. The patient elected to proceed with Oral Contraceptive.   Edinburgh Postnatal Depression Scale - 01/16/24 1547       Edinburgh Postnatal Depression Scale:  In the Past 7 Days   I have been able to laugh and see the funny side of things. 0    I have looked forward with enjoyment to things. 0    I have blamed myself unnecessarily when things went wrong. 0    I have been anxious or worried for no good reason. 0    I have felt scared or panicky for no good reason. 0    Things have been getting on top of me. 0    I have been so unhappy that I have had difficulty sleeping. 0    I have felt sad or miserable. 0    I have been so unhappy that I have been crying. 0    The thought of harming myself has occurred to me. 0    Edinburgh Postnatal  Depression Scale Total 0          Health Maintenance Due  Topic Date Due   COVID-19 Vaccine (1) Never done   Hepatitis B Vaccines 19-59 Average Risk (1 of 3 - 19+ 3-dose series) Never done   HPV VACCINES (1 - 3-dose SCDM series) Never done   Influenza Vaccine  11/23/2023     Review of Systems Review of Systems  Constitutional:  Negative for chills and fever.  HENT:  Negative for congestion, ear discharge, ear pain, hearing loss, sinus pain and sore throat.   Eyes:  Negative for blurred vision and double vision.  Respiratory:  Negative for cough, shortness of breath and wheezing.   Cardiovascular:  Negative for chest pain, palpitations and leg swelling.  Gastrointestinal:  Negative for abdominal pain, blood in stool, constipation, diarrhea, heartburn, melena, nausea and vomiting.  Genitourinary:  Negative for dysuria, flank pain, frequency, hematuria and urgency.  Musculoskeletal:  Negative for back pain, joint pain and myalgias.  Skin:  Negative for itching and rash.  Neurological:  Negative for dizziness, tingling, tremors, sensory change, speech change, focal weakness, seizures, loss of consciousness, weakness and headaches.  Endo/Heme/Allergies:  Negative for environmental allergies. Does not bruise/bleed easily.  Psychiatric/Behavioral:  Negative for depression,  hallucinations, memory loss, substance abuse and suicidal ideas. The patient is not nervous/anxious and does not have insomnia.      Objective:  BP (!) 87/57   Pulse 76   Ht 5' 4.25 (1.632 m)   Wt 162 lb 12.8 oz (73.8 kg)   Breastfeeding No   BMI 27.73 kg/m    General:  alert, cooperative, and no distress   Breasts:  normal  Lungs: clear to auscultation bilaterally  Heart:  regular rate and rhythm  Abdomen: soft, non-tender; bowel sounds normal; no masses,  no organomegaly 1 FB diastasis  Wound NA  GU exam:  normal       Assessment:   Routine postpartum care at 6 weeks  Normal postpartum exam.    Plan:   Essential components of care per ACOG recommendations:  1.  Mood and well being: Patient with negative depression screening today. Reviewed local resources for support.  - Patient tobacco use? No.   - hx of drug use? No.    2. Infant care and feeding:  -Patient currently breastmilk feeding? No.  -Social determinants of health (SDOH) reviewed in EPIC. No concerns. The following needs were identified none  3. Sexuality, contraception and birth spacing - Patient does not want a pregnancy in the next year.   - Reviewed reproductive life planning. Reviewed contraceptive methods based on pt preferences and effectiveness.  Patient desired Oral Contraceptive today.    4. Sleep and fatigue -Encouraged family/partner/community support of 4 hrs of uninterrupted sleep to help with mood and fatigue  5. Physical Recovery  - Discussed patients delivery and complications. She describes her labor as good. - Patient had a Vaginal, no problems at delivery. Patient had a vaginal abrasion- hemostatic not repaired. Perineal healing reviewed. Patient expressed understanding - Patient has urinary incontinence? No. - Patient is safe to resume physical and sexual activity  6.  Health Maintenance - HM due items addressed Yes - Last pap smear  Diagnosis  Date Value Ref Range Status  06/08/2023 (A)  Final   - Atypical squamous cells, cannot exclude high grade squamous  06/08/2023 intraepithelial lesion (ASC-H) (A)  Final   Pap smear not done at today's visit.  Schedule coloposcopy for mid November -Breast Cancer screening indicated? No.   7. Chronic Disease/Pregnancy Condition follow up: Anemia; continue PNV  Return for colposcopy in mid November and for annual wellness exam in 1 year   Slater Rains, CNM [facility] [place], [facility] 01/16/2024

## 2024-01-16 NOTE — Patient Instructions (Signed)
 Birth Control Pills (Oral Contraceptives): What to Know Oral contraceptive pills, or birth control pills, are medicines that prevent pregnancy. They work by: Preventing the ovaries from releasing eggs. Thickening mucus in the lower part of the uterus called the cervix. This prevents sperm from getting in the uterus. Thinning the lining of the uterus. This prevents a fertilized egg from attaching to the lining. Birth control pills are highly effective at preventing pregnancy when you take them exactly as told. Birth control pills do not prevent sexually transmitted infections (STIs). Use condoms while taking birth control pills to help prevent STIs. What happens before starting birth control pills? Before you start taking birth control pills: You may have a physical exam, blood test, and Pap test. Your health care provider will make sure it's OK for you to use birth control pills. Birth control pills are not a good option for: People who smoke and are older than age 3. People who have or have had certain conditions, such as: High blood pressure. Deep vein thrombosis. Blood clots in the lungs. Stroke. Heart disease or recent heart attack. Peripheral vascular disease. Blood clotting disorder or history of blood clots. Certain cancers. Diabetes. Gallbladder disease. High cholesterol. Kidney disease. Liver disease. Migraine headaches. Systemic lupus erythematosus (SLE). Unusual vaginal bleeding. Ask your provider about the possible side effects of the birth control pills. It can take 2-3 months for your body to adjust to changes in hormone levels. Types of birth control pills  Birth control pills have the hormones estrogen and progestin in them, or progestin only. The combination pill This type of pill contains estrogen and progestin hormones. These pills come in packs of 21 or 28 pills. Some packs with 28-day pills contain estrogen and progestin for the first 21-24 days. Hormone-free  pills, called inactive pills, are taken for the final 4-7 days. You should have menstrual bleeding during the time you take the inactive pills. In packs with 21 pills, you take no pills for the remaining 7 days in a 28-day period of time. Menstrual bleeding happens during these days. Some people prefer taking a pill for 28 days to help create a routine. Extended-interval contraception pills come in packs of 91 pills. The first 84 pills have both estrogen and progestin. The last 7 pills are inactive pills. Menstrual bleeding happens during the inactive pill days. With this schedule, menstrual bleeding happens once every 3 months. Continuous contraception pills come in packs of 28 pills. All pills in the pack contain estrogen and progestin. With this schedule, regular menstrual bleeding does not happen. But there may be spotting or irregular bleeding. Progestin-only pills This type of pill is often called the mini-pill and contains the progestin hormone only. It comes in packs of 28 pills. In some packs, the last 4 pills are inactive pills. You need to take this pill at the same time every day to prevent pregnancy. Menstrual bleeding may not be regular or predictable. What are the advantages? Birth control provides reliable and continuous contraception if taken as told. It may treat or decrease symptoms of: Menstrual period cramps, heavy menstrual flow, or bleeding from the uterus that's not normal. Irregular menstrual cycle or bleeding. Acne, depending on the type of pill. Polycystic ovarian syndrome (PCOS). Endometriosis. Iron deficiency anemia. Premenstrual symptoms, including very bad depression, anxiety, or getting annoyed very easily. It may also: Reduce the risk of endometrial and ovarian cancer. Be used as emergency contraception. Prevent ectopic pregnancies and infections of the fallopian tubes. What can  make birth control pills less effective? Birth control pills may be less effective  if: You forget to take the pill every day. Birth control pills may not work as well if you miss more than one pill. You may need to use a back-up contraceptive. For progestin-only pills, it's especially important to take the pill at the same time each day. Even taking it 3 hours late can increase the risk of pregnancy. You have a disease of the stomach or intestines that makes your body less able to absorb the pill. You take birth control pills with other medicines that make them less effective, such as antibiotics, certain HIV medicines, and some seizure medicines. You take expired pills. You forget to restart the pill after 7 days of not taking it. This applies to the packs of 21 pills and extended-interval packs of 91 pills. What are the side effects and risks? Birth control pills can sometimes cause side effects, such as: Headache. Depression. Trouble sleeping. Nausea and vomiting, bloating, or fluid retention. Breast tenderness. Irregular bleeding or spotting during the first several months. Increase in blood pressure. Gallbladder problems. Liver injury. Unusual vaginal discharge, itching, or smell. Sun sensitivity. Birth control pills with estrogen and progestin may slightly increase the risk of: Blood clots. Heart attack. Stroke. Follow these instructions at home: Follow instructions from your provider about how to start taking your first cycle of birth control pills. Depending on when you start the pill, you may need to use a backup form of birth control, such as condoms, during the first week. Make sure you know what steps to take if you forget to take a pill. If you think you're pregnant, stop taking birth control pills right away. Contact a health care provider if: If you think you're pregnant. This information is not intended to replace advice given to you by your health care provider. Make sure you discuss any questions you have with your health care provider. Document  Revised: 10/22/2022 Document Reviewed: 10/22/2022 Elsevier Patient Education  2024 [employer].

## 2024-02-13 ENCOUNTER — Encounter: Payer: Self-pay | Admitting: Advanced Practice Midwife

## 2024-02-28 ENCOUNTER — Ambulatory Visit: Payer: Medicaid Other | Admitting: Cardiology

## 2024-03-05 ENCOUNTER — Ambulatory Visit: Admitting: Obstetrics & Gynecology

## 2024-03-05 ENCOUNTER — Other Ambulatory Visit (HOSPITAL_COMMUNITY)
Admission: RE | Admit: 2024-03-05 | Discharge: 2024-03-05 | Disposition: A | Discharge status: Home / Self Care | Source: Ambulatory Visit | Attending: Obstetrics & Gynecology | Admitting: Obstetrics & Gynecology

## 2024-03-05 VITALS — BP 110/72 | HR 82 | Ht 64.5 in | Wt 164.9 lb

## 2024-03-05 DIAGNOSIS — R87611 Atypical squamous cells cannot exclude high grade squamous intraepithelial lesion on cytologic smear of cervix (ASC-H): Secondary | ICD-10-CM | POA: Diagnosis present

## 2024-03-05 DIAGNOSIS — B977 Papillomavirus as the cause of diseases classified elsewhere: Secondary | ICD-10-CM | POA: Insufficient documentation

## 2024-03-05 DIAGNOSIS — N870 Mild cervical dysplasia: Secondary | ICD-10-CM

## 2024-03-05 NOTE — Progress Notes (Signed)
    GYNECOLOGY PROGRESS NOTE  Subjective:    Patient ID: Michelle Massey, female    DOB: Aug 18, 1990, 33 y.o.   MRN: [id number]  HPI  Patient is a 33 y.o. [id number] here for a colpo due to pap 05/2023 ASCUS cannot rule out HGSIL with + HR HPV.  The following portions of the patient's history were reviewed and updated as appropriate: allergies, current medications, past family history, past medical history, past social history, past surgical history, and problem list.  Review of Systems Pertinent items are noted in HPI.   Objective:   Blood pressure 110/72, pulse 82, height 5' 4.5 (1.638 m), weight 164 lb 14.4 oz (74.8 kg), last menstrual period 02/13/2024, not currently breastfeeding. Body mass index is 27.87 kg/m. Well nourished, well hydrated White female, no apparent distress She is ambulating and conversing normally. UPT negative, consent signed, time out done Speculum placed. Cervix prepped with acetic acid. Transformation zone seen in its entirety. Colpo adequate. Colposcopic findings normal with the exception of a small area of acetowhite changes at the 12 o'clock position. I obtained a biopsy there and used silver nitrate to achieve hemostasis. ECC obtained. She tolerated the procedure well.  Assessment:   1. Pap smear of cervix with ASCUS, cannot exclude HGSIL   2. High risk HPV infection      Plan:   1. 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

## 2024-03-10 LAB — SURGICAL PATHOLOGY

## 2024-03-11 ENCOUNTER — Ambulatory Visit: Payer: Self-pay | Admitting: Obstetrics & Gynecology

## 2024-05-08 ENCOUNTER — Telehealth: Payer: Self-pay

## 2024-05-08 MED ORDER — OPILL 0.075 MG PO TABS: 1.0000 | ORAL_TABLET | Freq: Every day | ORAL | 7 refills | Status: AC

## 2024-05-08 NOTE — Telephone Encounter (Signed)
 Contacted [facility] and cancelled additional refills will send in new prescription to preferred pharmacy. KW

## 2024-05-08 NOTE — Telephone Encounter (Signed)
 Patient contacted office requesting that prescription for OPILL  be transferred to her perferred pharmacy [facility]. KW

## 2024-06-02 ENCOUNTER — Ambulatory Visit: Payer: Medicaid Other | Admitting: Dermatology

## 2024-06-02 ENCOUNTER — Ambulatory Visit: Admitting: Dermatology
